# Patient Record
Sex: Female | Born: 1965 | Race: Black or African American | Hispanic: No | Marital: Single | Smoking: Never smoker
Health system: Southern US, Community
[De-identification: ages and names within clinical notes are randomized; demographics above are authoritative.]

## PROBLEM LIST (undated history)

## (undated) HISTORY — PX: TUBAL LIGATION: SHX77

---

## 1998-02-18 ENCOUNTER — Emergency Department (HOSPITAL_COMMUNITY): Admission: EM | Admit: 1998-02-18 | Discharge: 1998-02-18 | Payer: Self-pay | Admitting: Emergency Medicine

## 1998-07-29 ENCOUNTER — Inpatient Hospital Stay (HOSPITAL_COMMUNITY): Admission: AD | Admit: 1998-07-29 | Discharge: 1998-07-29 | Payer: Self-pay | Admitting: *Deleted

## 2000-03-20 ENCOUNTER — Emergency Department (HOSPITAL_COMMUNITY): Admission: EM | Admit: 2000-03-20 | Discharge: 2000-03-20 | Payer: Self-pay | Admitting: Emergency Medicine

## 2004-08-16 ENCOUNTER — Emergency Department (HOSPITAL_COMMUNITY): Admission: EM | Admit: 2004-08-16 | Discharge: 2004-08-16 | Payer: Self-pay | Admitting: Emergency Medicine

## 2010-01-12 ENCOUNTER — Emergency Department (HOSPITAL_COMMUNITY): Admission: EM | Admit: 2010-01-12 | Discharge: 2010-01-12 | Payer: Self-pay | Admitting: Family Medicine

## 2010-06-09 ENCOUNTER — Ambulatory Visit: Payer: Self-pay | Admitting: Family Medicine

## 2010-06-09 LAB — CONVERTED CEMR LAB
AST: 12 units/L (ref 0–37)
Albumin: 3.7 g/dL (ref 3.5–5.2)
BUN: 11 mg/dL (ref 6–23)
CO2: 23 meq/L (ref 19–32)
Calcium: 9.2 mg/dL (ref 8.4–10.5)
Chloride: 106 meq/L (ref 96–112)
Creatinine, Ser: 0.8 mg/dL (ref 0.40–1.20)
Hemoglobin: 10.3 g/dL — ABNORMAL LOW (ref 12.0–15.0)
Lymphocytes Relative: 47 % — ABNORMAL HIGH (ref 12–46)
Lymphs Abs: 1.8 10*3/uL (ref 0.7–4.0)
Monocytes Absolute: 0.4 10*3/uL (ref 0.1–1.0)
Monocytes Relative: 11 % (ref 3–12)
Neutro Abs: 1.5 10*3/uL — ABNORMAL LOW (ref 1.7–7.7)
Neutrophils Relative %: 40 % — ABNORMAL LOW (ref 43–77)
Potassium: 4.1 meq/L (ref 3.5–5.3)
RBC: 4.15 M/uL (ref 3.87–5.11)
TSH: 0.009 microintl units/mL — ABNORMAL LOW (ref 0.350–4.500)
WBC: 3.8 10*3/uL — ABNORMAL LOW (ref 4.0–10.5)

## 2010-08-04 ENCOUNTER — Emergency Department (HOSPITAL_COMMUNITY): Admission: EM | Admit: 2010-08-04 | Discharge: 2010-08-04 | Payer: Self-pay | Admitting: Family Medicine

## 2010-08-10 ENCOUNTER — Emergency Department (HOSPITAL_COMMUNITY): Admission: EM | Admit: 2010-08-10 | Discharge: 2010-08-10 | Payer: Self-pay | Admitting: Family Medicine

## 2010-08-12 ENCOUNTER — Emergency Department (HOSPITAL_COMMUNITY): Admission: EM | Admit: 2010-08-12 | Discharge: 2010-08-12 | Payer: Self-pay | Admitting: Emergency Medicine

## 2010-09-01 ENCOUNTER — Encounter (INDEPENDENT_AMBULATORY_CARE_PROVIDER_SITE_OTHER): Payer: Self-pay | Admitting: Family Medicine

## 2010-09-01 LAB — CONVERTED CEMR LAB
Ferritin: 19 ng/mL (ref 10–291)
Free Thyroxine Index: 2.5 (ref 1.0–3.9)
T3 Uptake Ratio: 32.6 % (ref 22.5–37.0)

## 2010-09-11 ENCOUNTER — Encounter (HOSPITAL_COMMUNITY)
Admission: RE | Admit: 2010-09-11 | Discharge: 2010-09-12 | Payer: Self-pay | Source: Home / Self Care | Attending: Family Medicine | Admitting: Family Medicine

## 2011-01-08 LAB — POCT RAPID STREP A (OFFICE): Streptococcus, Group A Screen (Direct): POSITIVE — AB

## 2012-01-07 ENCOUNTER — Emergency Department (HOSPITAL_COMMUNITY)
Admission: EM | Admit: 2012-01-07 | Discharge: 2012-01-07 | Disposition: A | Discharge status: Home / Self Care | Payer: Self-pay | Attending: Emergency Medicine | Admitting: Emergency Medicine

## 2012-01-07 ENCOUNTER — Encounter (HOSPITAL_COMMUNITY): Payer: Self-pay

## 2012-01-07 DIAGNOSIS — IMO0002 Reserved for concepts with insufficient information to code with codable children: Secondary | ICD-10-CM | POA: Insufficient documentation

## 2012-01-07 DIAGNOSIS — R454 Irritability and anger: Secondary | ICD-10-CM

## 2012-01-07 DIAGNOSIS — R4589 Other symptoms and signs involving emotional state: Secondary | ICD-10-CM | POA: Insufficient documentation

## 2012-01-07 LAB — RAPID URINE DRUG SCREEN, HOSP PERFORMED
Amphetamines: NOT DETECTED
Barbiturates: NOT DETECTED
Benzodiazepines: NOT DETECTED
Tetrahydrocannabinol: POSITIVE — AB

## 2012-01-07 LAB — CBC
Hemoglobin: 10.7 g/dL — ABNORMAL LOW (ref 12.0–15.0)
MCH: 25 pg — ABNORMAL LOW (ref 26.0–34.0)
MCHC: 32.9 g/dL (ref 30.0–36.0)
MCV: 75.9 fL — ABNORMAL LOW (ref 78.0–100.0)
RBC: 4.28 MIL/uL (ref 3.87–5.11)

## 2012-01-07 LAB — PREGNANCY, URINE: Preg Test, Ur: NEGATIVE

## 2012-01-07 LAB — COMPREHENSIVE METABOLIC PANEL
ALT: 10 U/L (ref 0–35)
AST: 17 U/L (ref 0–37)
Albumin: 3.7 g/dL (ref 3.5–5.2)
CO2: 23 mEq/L (ref 19–32)
Calcium: 9.1 mg/dL (ref 8.4–10.5)
Chloride: 107 mEq/L (ref 96–112)
Creatinine, Ser: 0.76 mg/dL (ref 0.50–1.10)
Sodium: 139 mEq/L (ref 135–145)

## 2012-01-07 MED ORDER — LORAZEPAM 1 MG PO TABS: 1.0000 mg | ORAL_TABLET | Freq: Three times a day (TID) | ORAL | Status: DC | PRN

## 2012-01-07 MED ORDER — IBUPROFEN 200 MG PO TABS: 600.0000 mg | ORAL_TABLET | Freq: Three times a day (TID) | ORAL | Status: DC | PRN

## 2012-01-07 MED ORDER — ZOLPIDEM TARTRATE 5 MG PO TABS: 5.0000 mg | ORAL_TABLET | Freq: Every evening | ORAL | Status: DC | PRN

## 2012-01-07 MED ORDER — ACETAMINOPHEN 325 MG PO TABS: 650.0000 mg | ORAL_TABLET | ORAL | Status: DC | PRN

## 2012-01-07 NOTE — ED Notes (Signed)
Tele-psych report given to Dr. Fonnie Jarvis.  Waiting for discharge.

## 2012-01-07 NOTE — Discharge Instructions (Signed)
 RESOURCE GUIDE  Dental Problems  Patients with Medicaid: [facility]                     [facility] [address]                                           [address] Phone:  [phone]                                                  Phone:  [phone]  If unable to pay or uninsured, contact:  [facility] or [facility] to become qualified for the adult dental clinic.  Chronic Pain Problems Contact [facility]  [phone] Patients need to be referred by their primary care doctor.  Insufficient Money for Medicine Contact [employer]:  call "211" or [facility] 271-5999.  No Primary Care Doctor Call [facility]  832-8000 Other agencies that provide inexpensive medical care    [facility]  [phone]     Internal Medicine  [phone]    [facility]  [phone]    Women's Clinic  [phone]    [facility]  [phone]    [facility]  [phone]  Psychological Services [facility]  [phone] [facility]  [phone] [facility]   [phone] (emergency services [phone])  Substance Abuse Resources [facility]  336-882-2125 [facility] 336-784-9470 [facility] 336-285-9073 [facility] 336-845-3988 Residential & Outpatient Substance Abuse Program  800-659-3381  Abuse/Neglect [place] Child Abuse Hotline (336) 641-3795 [place] [facility] Hotline 800-378-5315 (After Hours)  Emergency Shelter [facility] (336) 271-5985  [facility] (336) 275-9566 [facility] (704) 372-4663  MRSA Hotline #:   832-7006    [place] [facility]  [facility]     [employer]                          [facility] [address] [place]                       [address]      [address]  [place]                                                [place]                            [place] Phone:  [phone]                                   Phone:  [phone]                 Phone:  [phone]  [facility] Phone:    [phone]  [place] Child Abuse Hotline (336) 342-1394 (336) 342-3537 (After Hours)   
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
## 2012-01-07 NOTE — ED Notes (Signed)
Telepsych completed.  
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
## 2012-01-07 NOTE — ED Notes (Signed)
Tele-psyche started.

## 2012-01-07 NOTE — ED Notes (Signed)
Pt. Moved to room 30 TCU.

## 2012-05-06 ENCOUNTER — Encounter (HOSPITAL_COMMUNITY): Payer: Self-pay | Admitting: Emergency Medicine

## 2012-05-06 ENCOUNTER — Emergency Department (HOSPITAL_COMMUNITY)
Admission: EM | Admit: 2012-05-06 | Discharge: 2012-05-07 | Disposition: A | Discharge status: Home / Self Care | Payer: Self-pay | Attending: Emergency Medicine | Admitting: Emergency Medicine

## 2012-05-06 ENCOUNTER — Emergency Department (HOSPITAL_COMMUNITY): Payer: Self-pay

## 2012-05-06 DIAGNOSIS — F17200 Nicotine dependence, unspecified, uncomplicated: Secondary | ICD-10-CM | POA: Insufficient documentation

## 2012-05-06 DIAGNOSIS — M25561 Pain in right knee: Secondary | ICD-10-CM

## 2012-05-06 DIAGNOSIS — M1710 Unilateral primary osteoarthritis, unspecified knee: Secondary | ICD-10-CM | POA: Insufficient documentation

## 2012-05-06 DIAGNOSIS — M1711 Unilateral primary osteoarthritis, right knee: Secondary | ICD-10-CM

## 2012-05-06 LAB — URINALYSIS, ROUTINE W REFLEX MICROSCOPIC
Bilirubin Urine: NEGATIVE
Ketones, ur: NEGATIVE mg/dL
Nitrite: NEGATIVE
Protein, ur: NEGATIVE mg/dL
Urobilinogen, UA: 1 mg/dL (ref 0.0–1.0)
pH: 6 (ref 5.0–8.0)

## 2012-05-06 NOTE — ED Notes (Signed)
Patient presents stating she has been having lower back pain for about 3 months.  Pain to legs bilaterally, right knee swollen.  +pulses to legs bilaterally.

## 2012-05-06 NOTE — ED Notes (Signed)
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
## 2012-05-06 NOTE — ED Provider Notes (Signed)
History     CSN: [id number]  Arrival date & time 05/06/12  2101   First MD Initiated Contact with Patient 05/06/12 2226      Chief Complaint  Patient presents with  . Leg Pain    (Consider location/radiation/quality/duration/timing/severity/associated sxs/prior treatment) HPI Comments: She first started having pain in her right knee 3 weeks ago. She denies any trauma at the time of pain onset.The pain is worse with weightbearing, flexion, and walking down steps. The pain is relieved by elevation, tylenol, compression brace and rest.  She describes the pain as dull and a 4 out of 10 on the pain scale. She is not currently taking any other medications. She denies any surgeries in her right knee.She denies past history or family history of blood clots. She notes that she may have dislocated her right kneecap 2 months ago while dancing. The pain and swelling resolved with ice. Denies fever.    Patient is a 46 y.o. female presenting with leg pain. The history is provided by the patient.  Leg Pain  Pertinent negatives include no numbness.    History reviewed. No pertinent past medical history.  Past Surgical History  Procedure Date  . Tubal ligation     History reviewed. No pertinent family history.  History  Substance Use Topics  . Smoking status: Passive Smoker  . Smokeless tobacco: Not on file  . Alcohol Use: No    OB History    Grav Para Term Preterm Abortions TAB SAB Ect Mult Living                  Review of Systems  Constitutional: Negative for fever and chills.  Respiratory: Negative for cough and shortness of breath.   Cardiovascular: Negative for chest pain and leg swelling.  Musculoskeletal: Positive for joint swelling and arthralgias. Negative for myalgias.  Neurological: Negative for weakness and numbness.    Allergies  Lamictal  Home Medications   Current Outpatient Rx  Name Route Sig Dispense Refill  . ACETAMINOPHEN 500 MG PO TABS Oral Take 1,000 mg  by mouth every 6 (six) hours as needed. For pain.    Marland Kitchen DIPHENHYDRAMINE HCL 25 MG PO TABS Oral Take 50 mg by mouth every 8 (eight) hours as needed. For itching/anxiety.      BP 123/81  Pulse 89  Temp 98.4 F (36.9 C) (Oral)  Resp 18  SpO2 98%  Physical Exam  Nursing note and vitals reviewed. Constitutional: She is oriented to person, place, and time. She appears well-developed and well-nourished. No distress.  HENT:  Head: Normocephalic and atraumatic.  Neck: Neck supple.  Pulmonary/Chest: Effort normal.  Musculoskeletal:       Right knee: She exhibits normal range of motion, no swelling, no ecchymosis, no deformity, no laceration, no erythema, normal alignment, no LCL laxity, normal patellar mobility, no bony tenderness, normal meniscus and no MCL laxity. no tenderness found. No medial joint line, no lateral joint line, no MCL, no LCL and no patellar tendon tenderness noted.       Left knee: Normal.       Right ankle: She exhibits normal pulse.       Left ankle: She exhibits normal pulse.       Right lower leg: She exhibits no tenderness.       Left lower leg: She exhibits no tenderness.       Lower extremities: strength 5/5, sensation intact, distal pulses intact.  Right knee with no pain on anterior or  posterior stress, valgus or varus stress.  Thessaly test is negative.  Distal pulses intact. No calf tenderness.    Neurological: She is alert and oriented to person, place, and time.  Skin: Skin is warm and dry. No rash noted. She is not diaphoretic. No erythema.    ED Course  Procedures (including critical care time)   Labs Reviewed  URINALYSIS, ROUTINE W REFLEX MICROSCOPIC   Dg Knee Complete 4 Views Right  05/06/2012  *RADIOLOGY REPORT*  Clinical Data: Pain over past 3 weeks.  No known injury.  RIGHT KNEE - COMPLETE 4+ VIEW  Comparison: None.  Findings: Degenerative changes most notable patellofemoral joint space and medial tibiofemoral joint space.  No fracture or  dislocation.  IMPRESSION: Degenerative changes most notable patellofemoral joint space and medial tibiofemoral joint space.  Original Report Authenticated By: Fuller Canada, M.D.     1. Right knee pain   2. Degenerative joint disease of knee, right       MDM  Patient with 3 weeks of right knee pain, no specific injury, improved with compression and tylenol and rest.  Worse with activity.  Xray shows degenerative changes.  No clinical concern for septic joint or DVT or acute injury to knee.  Pt d/c home with ibuprofen, norco, ortho follow up.  Return precautions given.  Patient verbalizes understanding and agrees with plan.          Dillard Cannon [place], [place] 05/07/12 0025

## 2012-05-07 MED ORDER — IBUPROFEN 800 MG PO TABS: 800.0000 mg | ORAL_TABLET | Freq: Three times a day (TID) | ORAL | Status: AC | PRN

## 2012-05-07 MED ORDER — HYDROCODONE-ACETAMINOPHEN 5-325 MG PO TABS: 1.0000 | ORAL_TABLET | Freq: Four times a day (QID) | ORAL | Status: AC | PRN

## 2012-05-07 NOTE — ED Provider Notes (Signed)
Medical screening examination/treatment/procedure(s) were performed by non-physician practitioner and as supervising physician I was immediately available for consultation/collaboration.   Codey Burling E Benzion Mesta, MD 05/07/12 1617 

## 2012-09-07 ENCOUNTER — Emergency Department (HOSPITAL_COMMUNITY): Payer: No Typology Code available for payment source

## 2012-09-07 ENCOUNTER — Encounter (HOSPITAL_COMMUNITY): Payer: Self-pay | Admitting: *Deleted

## 2012-09-07 ENCOUNTER — Emergency Department (HOSPITAL_COMMUNITY)
Admission: EM | Admit: 2012-09-07 | Discharge: 2012-09-07 | Disposition: A | Discharge status: Home / Self Care | Payer: No Typology Code available for payment source | Attending: Emergency Medicine | Admitting: Emergency Medicine

## 2012-09-07 DIAGNOSIS — Y9289 Other specified places as the place of occurrence of the external cause: Secondary | ICD-10-CM | POA: Insufficient documentation

## 2012-09-07 DIAGNOSIS — Y9389 Activity, other specified: Secondary | ICD-10-CM | POA: Insufficient documentation

## 2012-09-07 DIAGNOSIS — S0993XA Unspecified injury of face, initial encounter: Secondary | ICD-10-CM | POA: Insufficient documentation

## 2012-09-07 DIAGNOSIS — R51 Headache: Secondary | ICD-10-CM | POA: Insufficient documentation

## 2012-09-07 DIAGNOSIS — IMO0002 Reserved for concepts with insufficient information to code with codable children: Secondary | ICD-10-CM | POA: Insufficient documentation

## 2012-09-07 MED ORDER — DIAZEPAM 5 MG PO TABS: 5.0000 mg | ORAL_TABLET | Freq: Two times a day (BID) | ORAL | Status: DC

## 2012-09-07 MED ORDER — HYDROCODONE-ACETAMINOPHEN 5-325 MG PO TABS: 2.0000 | ORAL_TABLET | Freq: Four times a day (QID) | ORAL | Status: DC | PRN

## 2012-09-07 NOTE — ED Notes (Signed)
Patient verbalized understanding of discharge instructions, follow up care and rx x2. 
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
## 2012-09-08 NOTE — ED Provider Notes (Signed)
Medical screening examination/treatment/procedure(s) were performed by non-physician practitioner and as supervising physician I was immediately available for consultation/collaboration.  Doug Sou, MD 09/08/12 1742

## 2012-10-19 IMAGING — CR DG KNEE COMPLETE 4+V*R*
4 series · 4 of 4 positions shown · non-contrast
Comparison: None.

CLINICAL DATA: Pain over past 3 weeks.  No known injury.

RIGHT KNEE - COMPLETE 4+ VIEW

[x knee ap right]
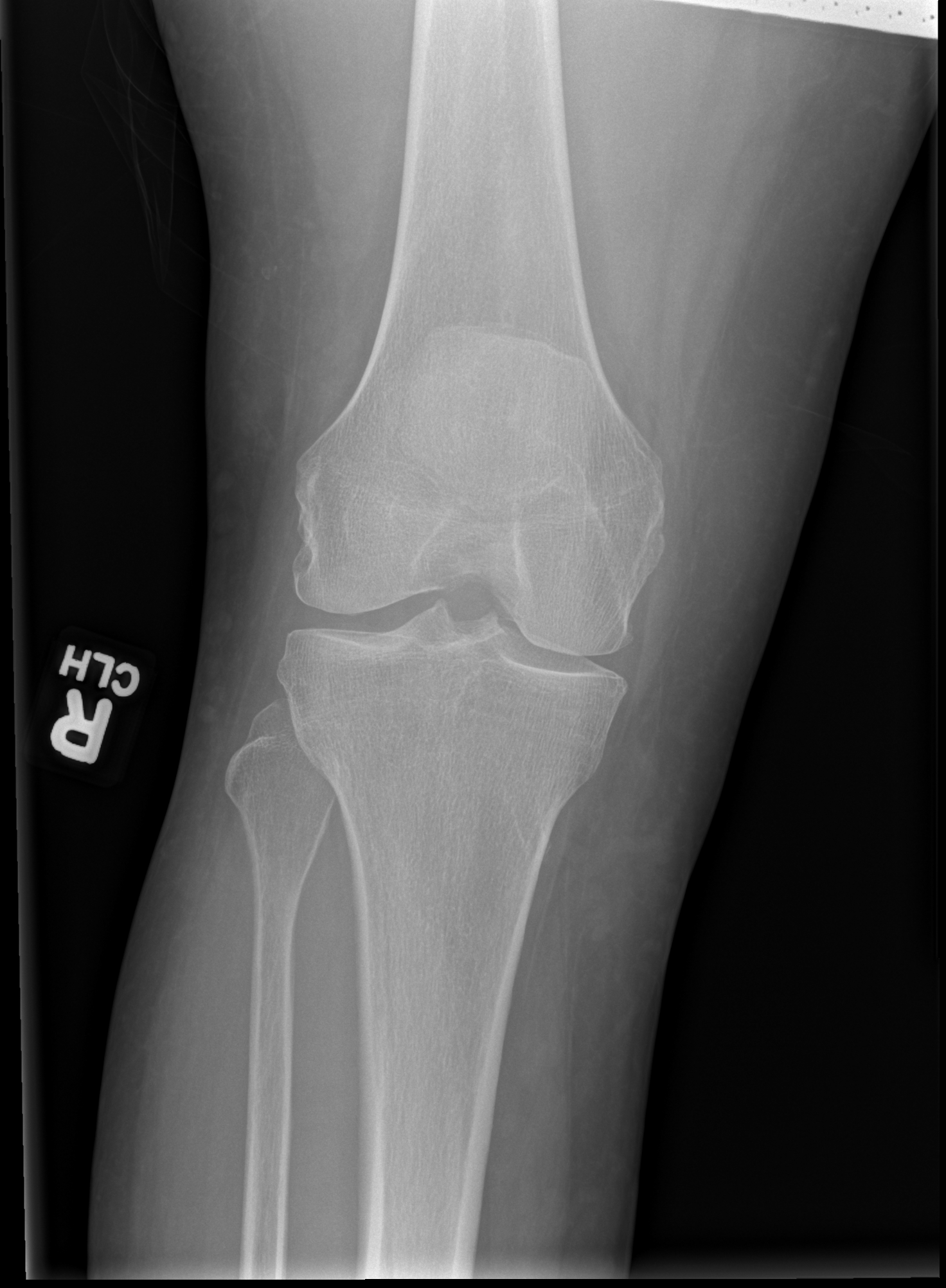

[x knee obl right (1 of 2)]
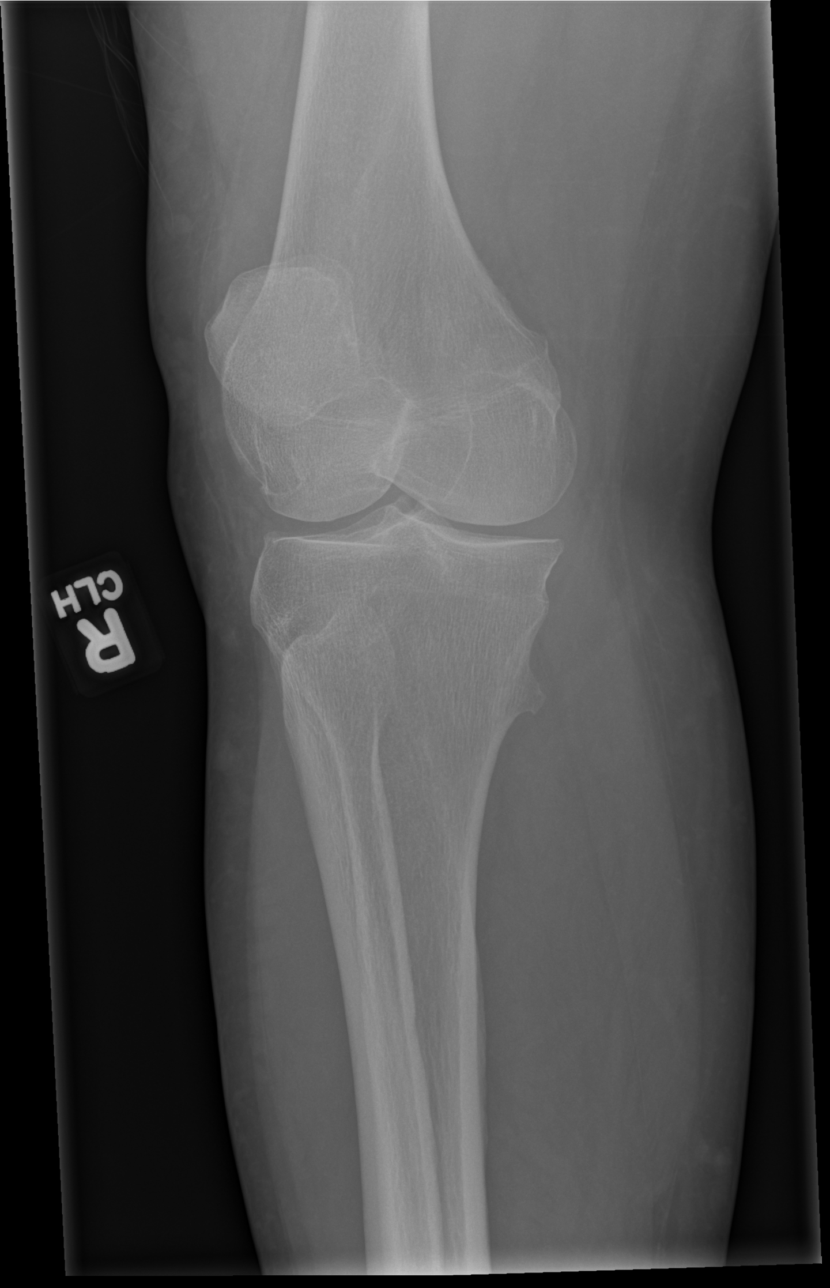

[x knee obl right (2 of 2)]
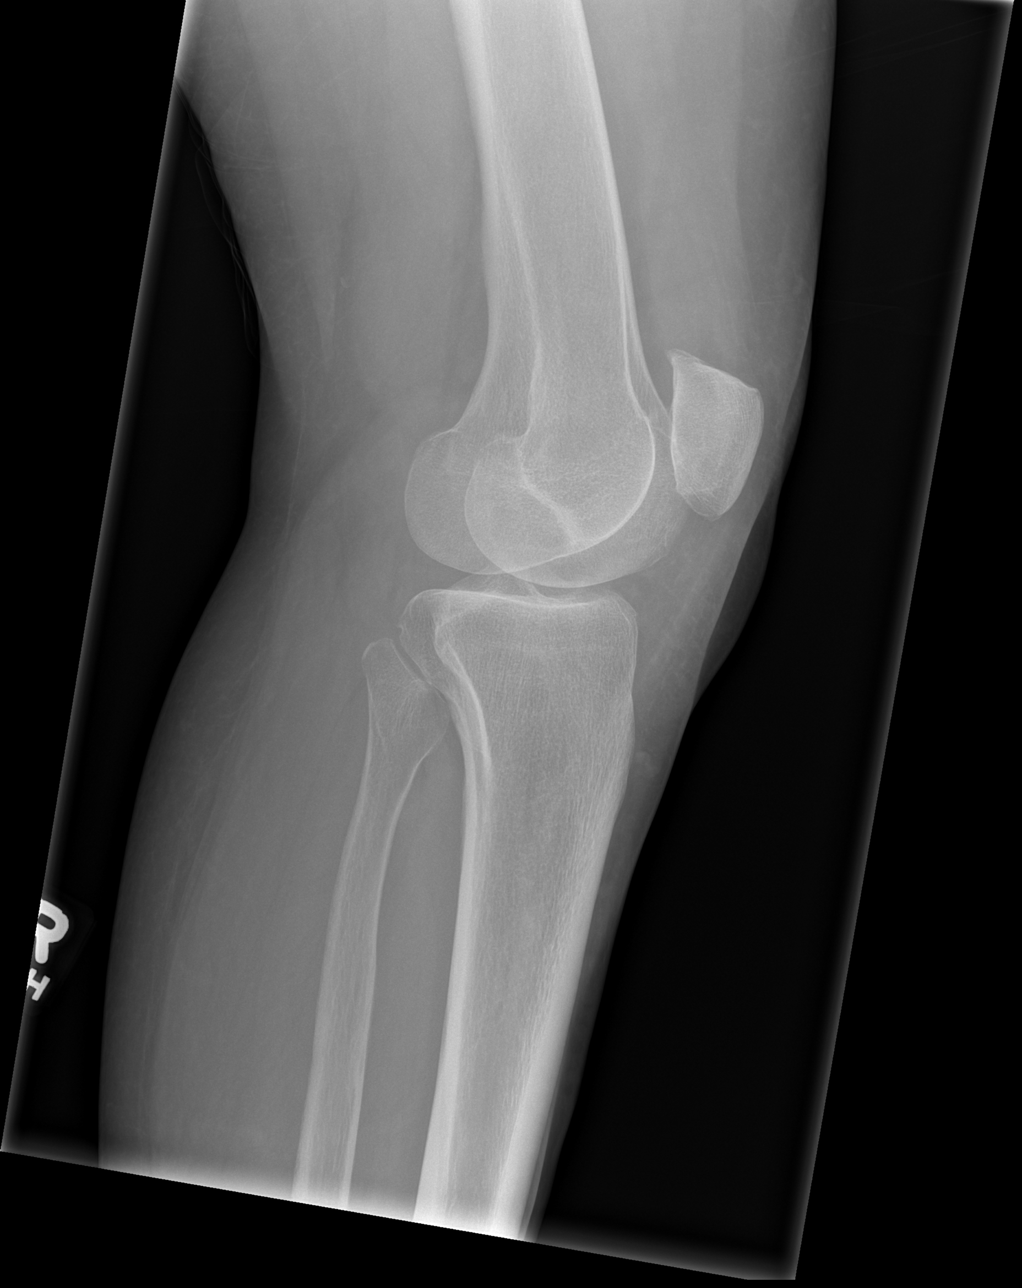

[x knee lat right]
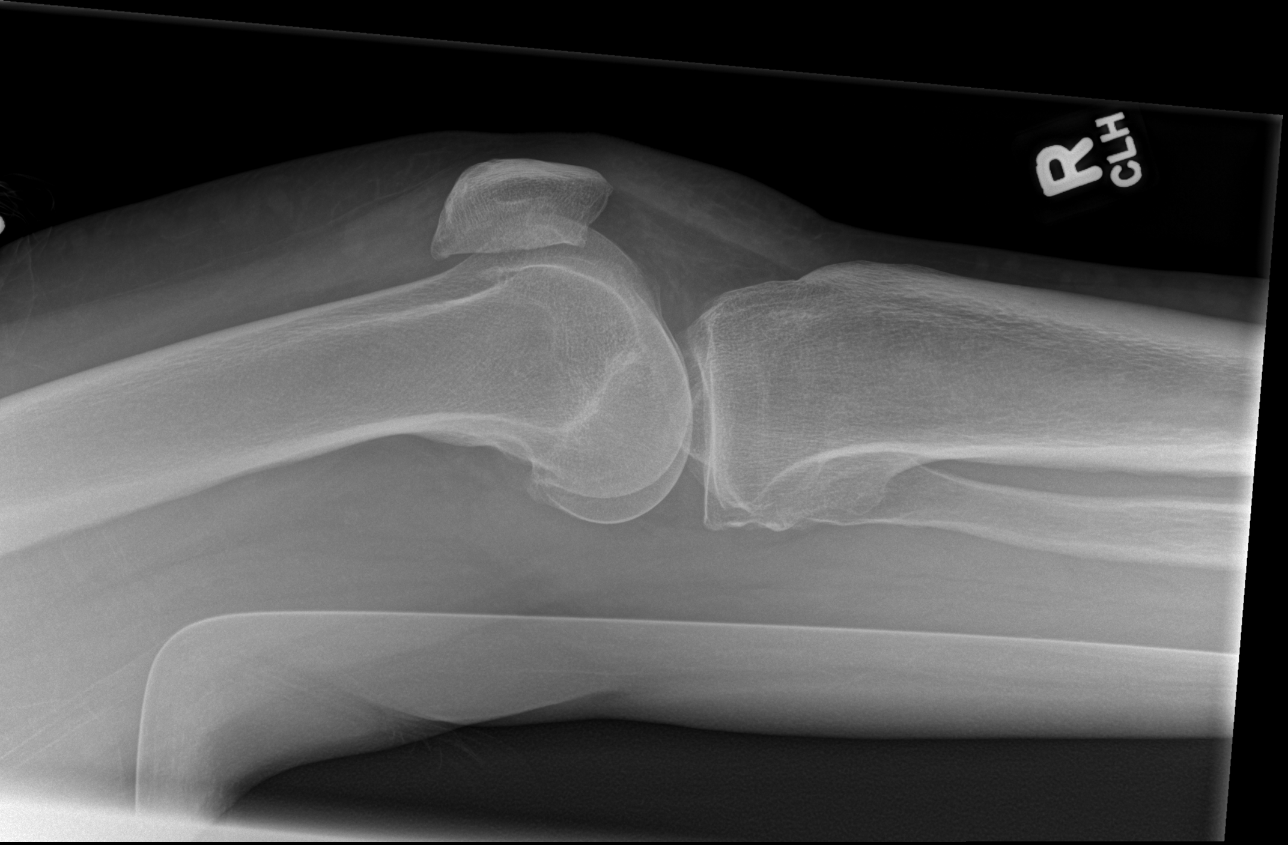

[4 of 4 positions shown; findings below may reference images not displayed]

FINDINGS: Degenerative changes most notable patellofemoral joint
space and medial tibiofemoral joint space.

No fracture or dislocation.
IMPRESSION: Degenerative changes most notable patellofemoral joint space and
medial tibiofemoral joint space.

## 2021-04-08 ENCOUNTER — Other Ambulatory Visit: Payer: Self-pay

## 2021-04-08 ENCOUNTER — Ambulatory Visit (HOSPITAL_COMMUNITY)
Admission: EM | Admit: 2021-04-08 | Discharge: 2021-04-08 | Disposition: A | Discharge status: Home / Self Care | Payer: No Payment, Other | Attending: Nurse Practitioner | Admitting: Nurse Practitioner

## 2021-04-08 DIAGNOSIS — Z046 Encounter for general psychiatric examination, requested by authority: Secondary | ICD-10-CM

## 2021-04-08 DIAGNOSIS — F39 Unspecified mood [affective] disorder: Secondary | ICD-10-CM | POA: Insufficient documentation

## 2021-04-08 DIAGNOSIS — R462 Strange and inexplicable behavior: Secondary | ICD-10-CM | POA: Insufficient documentation

## 2021-04-08 NOTE — Discharge Instructions (Addendum)
Recommend patient follow up with a primary care provider to have thyroid checked.   Discharge recommendations:  Please follow-up with psychiatry and therapy. Please follow up with your primary care provider for all medical related needs.   Therapy: We recommend that patient participate in individual therapy to address mental health concerns.  Safety:  The patient should abstain from use of illicit substances/drugs and abuse of any medications. If symptoms worsen or do not continue to improve or if the patient becomes actively suicidal or homicidal then it is recommended that the patient return to the closest hospital emergency department, the [facility], or call 911 for further evaluation and treatment. National Suicide Prevention Lifeline 1-800-SUICIDE or 820-280-3888.
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
## 2021-04-08 NOTE — ED Notes (Signed)
Patient given AVS and reviewed it with provider. Patient discharged ambulatory to lobby per order by Barbara Cower, NP.  Patient discharged in stable condition.  No acute distress noted.  All belongings returned to patient.

## 2021-04-08 NOTE — ED Provider Notes (Addendum)
Behavioral Health Urgent Care Medical Screening Exam  Patient Name: Tracy Keith MRN: [id number] Date of Evaluation: 04/08/21 Chief Complaint:   Diagnosis:  Final diagnoses:  Unspecified mood (affective) disorder (HCC)  Involuntary commitment    History of Present illness: Tracy Keith is a 55 y.o. female who presents to [facility] with law enforcement under IVC. Patient was petitioned for IVC by her daughter, Chyrel Masson.  "respondent has been diagnosed with schizophrenia; respondent has been talking herself, accusing housmates of poisoning her. 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   On evaluation, patient is alert and oriented x4.  She is initially calm and cooperative.  Her speech is clear and coherent.  When asked why did she think she was IVCd, she states "this is [place], minimum wage is 7.25, this is just [place] for you."  Patient denies suicidal ideations.  She denies homicidal ideations.  She denies auditory and visual hallucinations.  She denies paranoia.  Denies thought insertion.  Patient denies any psychiatric history.  Patient reports that she is a Engineer, civil (consulting).  She states that she worked in [place] for several years then she went to [place] and was there when COVID started.  States that when she was in [place] she was diagnosed with thyroid nodules.  She states that she has not followed up with anyone regarding her thyroid.  Discussed the importance of following up with thyroid concerns.  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 She states that it is about 7 people.  She states that they are  stealing narcotics and selling them so they can purchase [employer].  She she states that she thinks someone from HR probably stole her license when she was working in [place].  Based on these statements, it appears that the patient is experiencing a delusion.  However, at this time it does not appear she is an imminent risk to herself or others.  Discussed continuous assessment, patient adamantly refuses to have COVID testing, labs, and participate in admission.  Patient became very irritable towards this provider and nursing staff.  Patient was again very pleasant once she was informed that she was going to be discharged.  She expressed her gratitude for our care and stated that this was a really nice facility.   Collateral:  Patient provided TTS and provider verbal consent to contact her daughters. TTS and provider contacted the patient's daughter, Manuella Ghazi  pts daughter reports that her mother has been verbally  aggressive with others and that she thinks she has schizophrenia because she has taken Jordan in the past. Ms. Yetta Barre states that she is not sure if the patient has actually received a diagnosis of schizophrenia. Pts daughter reports that her mother is paranoid--thinks people are trying to poison her and that her mother thinks that people are stealing her personal belongings whenever she can't find anything. Pts daughter feels she has thyroid issues that could be a contributing factor to her mood fluctuations. Patient's daughter denies that the patient has made any suicidal statements or homicidal statements.   Psychiatric Specialty Exam  Presentation  General Appearance:Appropriate for Environment; Well Groomed  Eye  Contact:Good  Speech:Clear and Coherent; Normal Rate  Speech Volume:Normal  Handedness:Right   Mood and Affect  Mood:Irritable  Affect:Congruent   Thought Process  Thought Processes:Coherent; Linear  Descriptions of Associations:Intact  Orientation:Full  (Time, Place and Person)  Thought Content:Delusions  Diagnosis of Schizophrenia or Schizoaffective disorder in past: No  Duration of Psychotic Symptoms: Greater than six months  Hallucinations:None  Ideas of Reference:Delusions  Suicidal Thoughts:No  Homicidal Thoughts:No   Sensorium  Memory:Immediate Good; Recent Good; Remote Good  Judgment:Intact  Insight:Present   Executive Functions  Concentration:Good  Attention Span:Good  Recall:Good  Fund of Knowledge:Good  Language:Good   Psychomotor Activity  Psychomotor Activity:Normal   Assets  Assets:Communication Skills; Housing; Physical Health; Resilience; Social Support   Sleep  Sleep:Fair  Number of hours:  No data recorded  No data recorded  Physical Exam: Physical Exam Constitutional:      General: She is not in acute distress.    Appearance: She is not ill-appearing, toxic-appearing or diaphoretic.  HENT:     Head: Normocephalic.  Eyes:     Pupils: Pupils are equal, round, and reactive to light.  Cardiovascular:     Rate and Rhythm: Normal rate.  Pulmonary:     Effort: Pulmonary effort is normal. No respiratory distress.  Musculoskeletal:        General: Normal range of motion.  Neurological:     General: No focal deficit present.     Mental Status: She is alert and oriented to person, place, and time.  Psychiatric:        Speech: Speech normal.        Thought Content: Thought content is delusional. Thought content is not paranoid. Thought content does not include homicidal or suicidal ideation.   Review of Systems  Constitutional:  Negative for chills, diaphoresis, fever, malaise/fatigue and weight loss.  Respiratory:  Negative for cough and shortness of breath.   Cardiovascular:  Negative for chest pain.  Gastrointestinal:  Negative for diarrhea, nausea and vomiting.  Psychiatric/Behavioral:  Negative for depression, hallucinations, memory loss, substance abuse and suicidal ideas. The  patient is not nervous/anxious and does not have insomnia.    Blood pressure 129/89, pulse 83, temperature 98.4 F (36.9 C), temperature source Tympanic, resp. rate 16, SpO2 98 %. There is no height or weight on file to calculate BMI.  Musculoskeletal: Strength & Muscle Tone: within normal limits Gait & Station: normal Patient leans: N/A   [facility] MSE Discharge Disposition for Follow up and Recommendations: Based on my evaluation the patient does not appear to have an emergency medical condition and can be discharged with resources and follow up care in outpatient services for Medication Management and Individual Therapy  Based on my evaluation, TTS assessment, and collateral information this patient does not appear to be an imminent risk to herself or others. IVC rescinded.   Patient encouraged to follow up at [facility] during walk-in hours.   Recommend follow up with a primary care provider regarding thyroid concerns.    Jackelyn Poling, NP 04/08/2021, 9:15 PM

## 2021-05-29 DIAGNOSIS — Z5320 Procedure and treatment not carried out because of patient's decision for unspecified reasons: Secondary | ICD-10-CM | POA: Insufficient documentation

## 2021-06-25 DIAGNOSIS — E041 Nontoxic single thyroid nodule: Secondary | ICD-10-CM | POA: Insufficient documentation

## 2021-09-04 ENCOUNTER — Emergency Department (HOSPITAL_COMMUNITY)
Admission: EM | Admit: 2021-09-04 | Discharge: 2021-09-04 | Discharge status: Against Medical Advice | Payer: Self-pay | Source: Home / Self Care

## 2021-09-04 ENCOUNTER — Other Ambulatory Visit: Payer: Self-pay

## 2021-09-04 ENCOUNTER — Emergency Department (HOSPITAL_COMMUNITY)
Admission: EM | Admit: 2021-09-04 | Discharge: 2021-09-05 | Disposition: A | Discharge status: Home / Self Care | Payer: Self-pay | Attending: Emergency Medicine | Admitting: Emergency Medicine

## 2021-09-04 DIAGNOSIS — F22 Delusional disorders: Secondary | ICD-10-CM | POA: Insufficient documentation

## 2021-09-04 DIAGNOSIS — Z7722 Contact with and (suspected) exposure to environmental tobacco smoke (acute) (chronic): Secondary | ICD-10-CM | POA: Insufficient documentation

## 2021-09-04 DIAGNOSIS — E876 Hypokalemia: Secondary | ICD-10-CM | POA: Insufficient documentation

## 2021-09-04 DIAGNOSIS — T383X1A Poisoning by insulin and oral hypoglycemic [antidiabetic] drugs, accidental (unintentional), initial encounter: Secondary | ICD-10-CM | POA: Insufficient documentation

## 2021-09-04 DIAGNOSIS — F419 Anxiety disorder, unspecified: Secondary | ICD-10-CM | POA: Insufficient documentation

## 2021-09-04 DIAGNOSIS — R42 Dizziness and giddiness: Secondary | ICD-10-CM | POA: Insufficient documentation

## 2021-09-04 DIAGNOSIS — T40412A Poisoning by fentanyl or fentanyl analogs, intentional self-harm, initial encounter: Secondary | ICD-10-CM | POA: Insufficient documentation

## 2021-09-04 DIAGNOSIS — Z20822 Contact with and (suspected) exposure to covid-19: Secondary | ICD-10-CM | POA: Insufficient documentation

## 2021-09-04 DIAGNOSIS — R002 Palpitations: Secondary | ICD-10-CM | POA: Insufficient documentation

## 2021-09-04 DIAGNOSIS — R030 Elevated blood-pressure reading, without diagnosis of hypertension: Secondary | ICD-10-CM | POA: Insufficient documentation

## 2021-09-04 LAB — HCG, QUANTITATIVE, PREGNANCY: hCG, Beta Chain, Quant, S: 2 m[IU]/mL (ref <5)

## 2021-09-04 LAB — RESP PANEL BY RT-PCR (FLU A&B, COVID) ARPGX2
Influenza A by PCR: NEGATIVE
Influenza B by PCR: NEGATIVE
SARS Coronavirus 2 by RT PCR: NEGATIVE

## 2021-09-04 LAB — CBC WITH DIFFERENTIAL/PLATELET
Abs Immature Granulocytes: 0.01 10*3/uL (ref 0.00–0.07)
Basophils Absolute: 0 10*3/uL (ref 0.0–0.1)
Basophils Relative: 1 %
Eosinophils Absolute: 0.3 10*3/uL (ref 0.0–0.5)
Eosinophils Relative: 5 %
HCT: 41 % (ref 36.0–46.0)
Hemoglobin: 13 g/dL (ref 12.0–15.0)
Immature Granulocytes: 0 %
Lymphocytes Relative: 51 %
Lymphs Abs: 2.7 10*3/uL (ref 0.7–4.0)
MCH: 26.6 pg (ref 26.0–34.0)
MCHC: 31.7 g/dL (ref 30.0–36.0)
MCV: 84 fL (ref 80.0–100.0)
Monocytes Absolute: 0.5 10*3/uL (ref 0.1–1.0)
Monocytes Relative: 10 %
Neutro Abs: 1.7 10*3/uL (ref 1.7–7.7)
Neutrophils Relative %: 33 %
Platelets: 260 10*3/uL (ref 150–400)
RBC: 4.88 MIL/uL (ref 3.87–5.11)
RDW: 14.1 % (ref 11.5–15.5)
WBC: 5.2 10*3/uL (ref 4.0–10.5)
nRBC: 0 % (ref 0.0–0.2)

## 2021-09-04 LAB — RAPID URINE DRUG SCREEN, HOSP PERFORMED
Amphetamines: NOT DETECTED
Barbiturates: NOT DETECTED
Benzodiazepines: NOT DETECTED
Cocaine: NOT DETECTED
Opiates: NOT DETECTED
Tetrahydrocannabinol: NOT DETECTED

## 2021-09-04 LAB — COMPREHENSIVE METABOLIC PANEL
ALT: 23 U/L (ref 0–44)
AST: 21 U/L (ref 15–41)
Albumin: 4 g/dL (ref 3.5–5.0)
Alkaline Phosphatase: 89 U/L (ref 38–126)
Anion gap: 9 (ref 5–15)
BUN: 8 mg/dL (ref 6–20)
CO2: 24 mmol/L (ref 22–32)
Calcium: 9.6 mg/dL (ref 8.9–10.3)
Chloride: 106 mmol/L (ref 98–111)
Creatinine, Ser: 0.79 mg/dL (ref 0.44–1.00)
GFR, Estimated: >60 mL/min (ref >60)
Glucose, Bld: 110 mg/dL — ABNORMAL HIGH (ref 70–99)
Potassium: 3.2 mmol/L — ABNORMAL LOW (ref 3.5–5.1)
Sodium: 139 mmol/L (ref 135–145)
Total Bilirubin: 0.4 mg/dL (ref 0.3–1.2)
Total Protein: 8.2 g/dL — ABNORMAL HIGH (ref 6.5–8.1)

## 2021-09-04 LAB — SALICYLATE LEVEL: Salicylate Lvl: <7 mg/dL — ABNORMAL LOW (ref 7.0–30.0)

## 2021-09-04 LAB — ACETAMINOPHEN LEVEL: Acetaminophen (Tylenol), Serum: <10 ug/mL — ABNORMAL LOW (ref 10–30)

## 2021-09-04 LAB — ETHANOL: Alcohol, Ethyl (B): <10 mg/dL (ref <10)

## 2021-09-04 NOTE — ED Notes (Addendum)
Pt resting.
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
## 2021-09-05 MED ORDER — POTASSIUM CHLORIDE CRYS ER 20 MEQ PO TBCR
40.0000 meq | EXTENDED_RELEASE_TABLET | Freq: Once | ORAL | Status: AC
  Administered 2021-09-05: 40 meq via ORAL
  Filled 2021-09-05: qty 2

## 2021-09-05 NOTE — ED Provider Notes (Signed)
[facility] [place] [facility]-EMERGENCY DEPT Provider Note   CSN: [id number] Arrival date & time: 09/04/21  2003     History Chief Complaint  Patient presents with   Anxiety    Tracy Keith is a 55 y.o. female.  HPI Patient is a 55 year old female presents to the ER today with concerns that she was poisoned with insulin or fentanyl.  She states that she started feeling somewhat shaky and anxious earlier today and has been feeling persistently this with since.  She denies any pain and she states she feels relatively well at this time.  Denies any lightheadedness dizziness cough congestion fevers or chills.  She states she did have some mild palpitations earlier I did feel somewhat lightheaded for a moment but did not pass out or coming to passing out.      No past medical history on file.  Patient Active Problem List   Diagnosis Date Noted   Episodic mood disorder [facility])     Past Surgical History:  Procedure Laterality Date   TUBAL LIGATION       OB History   No obstetric history on file.     No family history on file.  Social History   Tobacco Use   Smoking status: Passive Smoke Exposure - Never Smoker  Substance Use Topics   Alcohol use: No   Drug use: No    Home Medications Prior to Admission medications   Medication Sig Start Date End Date Taking? Authorizing Provider  sennosides-docusate sodium (SENOKOT-S) 8.6-50 MG tablet Take 2 tablets by mouth at bedtime.   Yes [provider]    Allergies    Lamictal [lamotrigine]  Review of Systems   Review of Systems  Constitutional:  Negative for chills and fever.  HENT:  Negative for congestion.   Eyes:  Negative for pain.  Respiratory:  Negative for cough and shortness of breath.   Cardiovascular:  Negative for chest pain and leg swelling.  Gastrointestinal:  Negative for abdominal pain, diarrhea, nausea and vomiting.  Genitourinary:  Negative for dysuria.  Musculoskeletal:  Negative for  myalgias.  Skin:  Negative for rash.  Neurological:  Negative for dizziness and headaches.  Psychiatric/Behavioral:  Positive for agitation.    Physical Exam Updated Vital Signs BP (!) 124/96   Pulse 81   Temp 98.8 F (37.1 C) (Oral)   Resp 18   Ht 5\' 4"  (1.626 m)   Wt 77.1 kg   SpO2 100%   BMI 29.18 kg/m   Physical Exam Vitals and nursing note reviewed.  Constitutional:      General: She is not in acute distress. HENT:     Head: Normocephalic and atraumatic.     Nose: Nose normal.     Mouth/Throat:     Mouth: Mucous membranes are moist.  Eyes:     General: No scleral icterus. Cardiovascular:     Rate and Rhythm: Normal rate and regular rhythm.     Pulses: Normal pulses.     Heart sounds: Normal heart sounds.  Pulmonary:     Effort: Pulmonary effort is normal. No respiratory distress.     Breath sounds: Normal breath sounds. No wheezing.  Abdominal:     Palpations: Abdomen is soft.     Tenderness: There is no abdominal tenderness. There is no guarding or rebound.  Musculoskeletal:     Cervical back: Normal range of motion.     Right lower leg: No edema.     Left lower leg: No edema.  Skin:    General: Skin is warm and dry.     Capillary Refill: Capillary refill takes less than 2 seconds.  Neurological:     Mental Status: She is alert. Mental status is at baseline.  Psychiatric:        Mood and Affect: Mood normal.        Behavior: Behavior normal.    ED Results / Procedures / Treatments   Labs (all labs ordered are listed, but only abnormal results are displayed) Labs Reviewed  COMPREHENSIVE METABOLIC PANEL - Abnormal; Notable for the following components:      Result Value   Potassium 3.2 (*)    Glucose, Bld 110 (*)    Total Protein 8.2 (*)    All other components within normal limits  SALICYLATE LEVEL - Abnormal; Notable for the following components:   Salicylate Lvl <7.0 (*)    All other components within normal limits  ACETAMINOPHEN LEVEL -  Abnormal; Notable for the following components:   Acetaminophen (Tylenol), Serum <10 (*)    All other components within normal limits  RESP PANEL BY RT-PCR (FLU A&B, COVID) ARPGX2  CBC WITH DIFFERENTIAL/PLATELET  RAPID URINE DRUG SCREEN, HOSP PERFORMED  ETHANOL  HCG, QUANTITATIVE, PREGNANCY    EKG EKG Interpretation  Date/Time:  Friday September 05 2021 00:35:51 EST Ventricular Rate:  72 PR Interval:  175 QRS Duration: 85 QT Interval:  420 QTC Calculation: 460 R Axis:   53 Text Interpretation: Sinus rhythm Normal ECG Confirmed by Molpus, John ([id number]) on 09/05/2021 12:54:44 AM  Radiology No results found.  Procedures Procedures   Medications Ordered in ED Medications  potassium chloride SA (KLOR-CON) CR tablet 40 mEq (40 mEq Oral Given 09/05/21 0042)    ED Course  I have reviewed the triage vital signs and the nursing notes.  Pertinent labs & imaging results that were available during my care of the patient were reviewed by me and considered in my medical decision making (see chart for details).  Clinical Course as of 09/06/21 0735  Fri Sep 05, 2021  0010 Pulse Rate: 78 [WF]  0010 BP(!): 136/92 [WF]  0010 Resp: 18 [WF]  0010 SpO2: 98 % [WF]  0010 Temp: 98.8 F (37.1 C) [WF]    Clinical Course User Index [WF] Gailen Shelter, PA   MDM Rules/Calculators/A&P                          Patient is a 55 year old female presented to ER today with concerns about being poisoned with either insulin or fentanyl.  Her urine drug screen is negative her lab work is unremarkable apart from mild hypokalemia which she will follow-up with PCP to reevaluate Tylenol salicylates negative.  Ethanol negative.  hCG negative pregnancy.  CBC without leukocytosis or anemia COVID influenza negative.  Patient given 1 dose of potassium she will follow-up with PCP for recheck.  Vital signs were normal limits.  She seems somewhat paranoid but does not seem to have any dangerous delusions is not  having any auditory or visual hallucinations and tells me that she has never had any consideration of suicide nor does she plan to hurt or kill anyone.  She is alert and oriented x3 she is well-appearing on examination seems more concerned than anything. Blood sugar within normal limits here we will discharge patient home she has no symptoms currently she will follow-up with PCP given information for cardiology given palpitations.  Return cautions given and  reviewed.  She is understanding of plan and agreeable.  Patient seems very reasonable throughout encounter.  Final Clinical Impression(s) / ED Diagnoses Final diagnoses:  Palpitations  Elevated blood pressure reading    Rx / DC Orders ED Discharge Orders     None        Gailen Shelter, [place] 09/06/21 0736    Paula Libra, MD 09/06/21 2232

## 2021-09-05 NOTE — Discharge Instructions (Addendum)
Please follow-up with the [facility].  I given you their information.  Please call tomorrow morning to make an appointment. Your blood pressure here in the ER is not severely elevated.  You will have your blood pressure rechecked when you follow-up with the primary care office. Your labs are reassuring without any significant abnormalities apart from low potassium you are given 1 dose of supplementation here you will need to have this potassium level rechecked with your primary care.  Your urinalysis does not show any evidence of opioids/narcotics or any other concerning findings.

## 2022-08-20 ENCOUNTER — Ambulatory Visit (HOSPITAL_COMMUNITY)
Admission: EM | Admit: 2022-08-20 | Discharge: 2022-08-20 | Disposition: A | Discharge status: Home / Self Care | Payer: No Payment, Other | Attending: Psychiatry | Admitting: Psychiatry

## 2022-08-20 DIAGNOSIS — Z79899 Other long term (current) drug therapy: Secondary | ICD-10-CM | POA: Insufficient documentation

## 2022-08-20 DIAGNOSIS — F3160 Bipolar disorder, current episode mixed, unspecified: Secondary | ICD-10-CM

## 2022-08-20 DIAGNOSIS — Z818 Family history of other mental and behavioral disorders: Secondary | ICD-10-CM | POA: Insufficient documentation

## 2022-08-20 NOTE — ED Notes (Signed)
Discharge instructions provided and Pt stated understanding. Pt alert, orient and ambulatory prior to d/c from facility. No personal belongings returned. Gave pt information regarding outpatient therapy and where to go. Safety maintained.

## 2022-08-20 NOTE — Progress Notes (Signed)
   08/20/22 1300  Tracy Keith (Walk-ins at [facility] only)  How Did You Hear About [place]? Self  What Is the Reason for Your Visit/Call Today? Patient recently relocated from [place] to live with family.  She reports she was recently charged with communicating threats to a judge in [place].  She states she disagreed with a decision the judge made and she left a threatening voicemail.  She states she served 4 mos and was placed on [place].  She is court ordered to stay on medication and continue with outpatient treatment.  She is Dx with Bipolar and is currently Rx Latuda.  She has 2 wks of medications left and is hoping to be connected with a psychiatrist today.  She is uninsured and informed about [facility] outpt program.  Patient denies SI, HI, AVH or SA hx.  How Long Has This Been Causing You Problems? <Week  Have You Recently Had Any Thoughts About Hurting Yourself? No  Are You Planning to Commit Suicide/Harm Yourself At This time? No  Have you Recently Had Thoughts About [place]? No  Are You Planning To Harm Someone At This Time? No  Are you currently experiencing any auditory, visual or other hallucinations? No  Have You Used Any Alcohol or Drugs in the Past 24 Hours? No  Do you have any current medical co-morbidities that require immediate attention? No  Clinician description of patient physical appearance/behavior: Patient is calm, coopeartive, engaging AAOx5.  What Do You Feel Would Help You the Most Today? Medication(s)  If access to [facility] was not available, would you have sought care in the Emergency Department? No  Determination of Need Routine (7 days)  Options For Referral Outpatient Therapy;Medication Management

## 2022-08-20 NOTE — ED Provider Notes (Addendum)
Behavioral Health Urgent Care Medical Screening Exam  Patient Name: Tracy Keith MRN: [id number] Date of Evaluation: 08/20/22 Chief Complaint:   Diagnosis:  Final diagnoses:  Mixed bipolar I disorder (HCC)    History of Present illness: Tracy Keith is a 56 y.o. female.  " I need to get medications... I also  need Therapy...".  Patient presents voluntarily reporting that she is looking for a mental health outpatient service to help with therapy and medication management. Reports that she has been diagnosed with Bipolar in 2010 and was recently put on Latuda, which is working. She reports that she often has mood swings accompanied with negative reactions and poor decision- making. She recently relocated from [place] to be with her family and needs a local mental health provider. Patient reports that she has a legal  charge that requires her to receive mental health services including medications and therapy. She is expressing motivation for treatment. Reports that she never had therapy in [place]. She used to see a therapist in [place], years ago, through [facility]. Patient is willing to start receiving therapy and medication management through [facility] outpatient services. Denies substance use and states that she used to smoke marijuana but quit last year. Patient is fully alert and oriented  and denying SI/HI/AVH. Does not appear to be responding to internal stimuli. Her thought process is clear and concise. Has good eye contact. She is pleasant on approach. She is well groomed and well nourished. She denies medical issues; reports that she recently visited ED due to low K+, which was resolved. She reports and she eats and sleeps well. Her current VS are WNL and she has no sign of distress. Patient reports a significant family history of mental health issues: her 2 sisters have been diagnosed with Bipolar and take medications. She reports that "mom has mental health issues but I can't remember  which ones". Patient currently lives with her mother who is supportive. Her sisters are supportive as well. She reports that she wants to "get myself together and then find a job". Was encouraged to follow up in outpatient services to start therapy and medication management. More resources and  emotional support provided.   Psychiatric Specialty Exam  Presentation  General Appearance:Appropriate for Environment  Eye Contact:Good  Speech:Clear and Coherent  Speech Volume:Normal  Handedness:Right   Mood and Affect  Mood: Euthymic  Affect: Appropriate; Congruent   Thought Process  Thought Processes: Coherent; Goal Directed; Linear  Descriptions of Associations:Intact  Orientation:Full (Time, Place and Person)  Thought Content:WDL  Diagnosis of Schizophrenia or Schizoaffective disorder in past: No data recorded Duration of Psychotic Symptoms: No data recorded Hallucinations:None  Ideas of Reference:None  Suicidal Thoughts:No  Homicidal Thoughts:No   Sensorium  Memory: Immediate Good; Recent Good; Remote Good  Judgment: Fair  Insight: Present   Executive Functions  Concentration: Good  Attention Span: Good  Recall: Good  Fund of Knowledge: Good  Language: Good   Psychomotor Activity  Psychomotor Activity: Normal   Assets  Assets: Communication Skills; Desire for Improvement; Housing; Physical Health   Sleep  Sleep: Good  Number of hours:  8   No data recorded  Physical Exam: Physical Exam Constitutional:      Appearance: Normal appearance.  HENT:     Head: Normocephalic and atraumatic.     Right Ear: Tympanic membrane normal.     Left Ear: Tympanic membrane normal.     Nose: Nose normal.  Eyes:     Extraocular Movements: Extraocular  movements intact.     Pupils: Pupils are equal, round, and reactive to light.  Cardiovascular:     Rate and Rhythm: Normal rate and regular rhythm.  Musculoskeletal:        General: Normal  range of motion.     Cervical back: Normal range of motion.  Neurological:     General: No focal deficit present.     Mental Status: She is alert and oriented to person, place, and time.  Psychiatric:        Mood and Affect: Mood normal.        Behavior: Behavior normal.        Thought Content: Thought content normal.        Judgment: Judgment normal.  Review of Systems  Constitutional: Negative.   HENT: Negative.    Eyes: Negative.   Respiratory: Negative.    Cardiovascular: Negative.   Gastrointestinal: Negative.   Genitourinary: Negative.   Musculoskeletal: Negative.   Skin: Negative.   Neurological: Negative.   Endo/Heme/Allergies: Negative.   Psychiatric/Behavioral: Negative.    Blood pressure 129/85, pulse 79, temperature 98.6 F (37 C), temperature source Oral, resp. rate 18, SpO2 99 %. There is no height or weight on file to calculate BMI.  Musculoskeletal: Strength & Muscle Tone: within normal limits Gait & Station: normal Patient leans: Right   [place] [facility] Discharge Disposition for Follow up and Recommendations: Based on my evaluation the patient does not appear to have an emergency medical condition and can be discharged with resources and follow up care in outpatient services for Medication Management, Individual Therapy, and Group Therapy  Continue current medication regimen and follow up with outpatient provider for refill as needed.    Tracy Nigh, NP 08/20/2022, 1:53 PM

## 2022-08-20 NOTE — Discharge Instructions (Addendum)
Discharge recommendations:  Patient is to take medications as prescribed. Please see information for follow-up appointment with psychiatry and therapy. Please follow up with your primary care provider for all medical related needs.    Therapy: We recommend that patient participate in individual therapy to address mental health concerns. Call [facility] to schedule an appointment  Atypical antipsychotics: If you are prescribed an atypical antipsychotic, it is recommended that your height, weight, BMI, blood pressure, fasting lipid panel, and fasting blood sugar be monitored by your outpatient providers.  Safety:  The patient should abstain from use of illicit substances/drugs and abuse of any medications. If symptoms worsen or do not continue to improve or if the patient becomes actively suicidal or homicidal then it is recommended that the patient return to the closest hospital emergency department, the [facility], or call 911 for further evaluation and treatment. National Suicide Prevention Lifeline 1-800-SUICIDE or 985-822-1904.  About 988 988 offers 24/7 access to trained crisis counselors who can help people experiencing mental health-related distress. People can call or text 988 or chat 988lifeline.org for themselves or if they are worried about a loved one who may need crisis support.

## 2022-09-02 ENCOUNTER — Encounter (HOSPITAL_COMMUNITY): Payer: Self-pay | Admitting: Student in an Organized Health Care Education/Training Program

## 2022-09-02 ENCOUNTER — Other Ambulatory Visit: Payer: Self-pay

## 2022-09-02 ENCOUNTER — Ambulatory Visit (INDEPENDENT_AMBULATORY_CARE_PROVIDER_SITE_OTHER): Payer: No Payment, Other | Admitting: Student in an Organized Health Care Education/Training Program

## 2022-09-02 VITALS — BP 116/75 | HR 79 | Ht 64.0 in | Wt 155.0 lb

## 2022-09-02 DIAGNOSIS — F411 Generalized anxiety disorder: Secondary | ICD-10-CM | POA: Diagnosis not present

## 2022-09-02 DIAGNOSIS — F3130 Bipolar disorder, current episode depressed, mild or moderate severity, unspecified: Secondary | ICD-10-CM | POA: Diagnosis not present

## 2022-09-02 MED ORDER — ESCITALOPRAM OXALATE 5 MG PO TABS: 5.0000 mg | ORAL_TABLET | Freq: Every day | ORAL | 1 refills | Status: DC

## 2022-09-02 MED ORDER — VENLAFAXINE HCL 37.5 MG PO TABS
37.5000 mg | ORAL_TABLET | Freq: Every day | ORAL | 1 refills | Status: DC
  Filled 2022-09-02: qty 30, 30d supply, fill #0

## 2022-09-02 MED ORDER — VENLAFAXINE HCL 37.5 MG PO TABS: 37.5000 mg | ORAL_TABLET | Freq: Every day | ORAL | 1 refills | Status: DC

## 2022-09-02 MED ORDER — LURASIDONE HCL 80 MG PO TABS: 80.0000 mg | ORAL_TABLET | Freq: Every day | ORAL | 1 refills | Status: DC

## 2022-09-02 MED ORDER — LURASIDONE HCL 80 MG PO TABS
80.0000 mg | ORAL_TABLET | Freq: Every day | ORAL | 1 refills | Status: DC
  Filled 2022-09-02: qty 30, 30d supply, fill #0
  Filled 2022-10-06: qty 30, 30d supply, fill #1
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
## 2022-09-03 ENCOUNTER — Other Ambulatory Visit: Payer: Self-pay

## 2022-09-04 ENCOUNTER — Other Ambulatory Visit: Payer: Self-pay

## 2022-09-22 ENCOUNTER — Ambulatory Visit (INDEPENDENT_AMBULATORY_CARE_PROVIDER_SITE_OTHER): Payer: No Payment, Other | Admitting: Licensed Clinical Social Worker

## 2022-09-22 DIAGNOSIS — F3130 Bipolar disorder, current episode depressed, mild or moderate severity, unspecified: Secondary | ICD-10-CM
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
## 2022-10-06 ENCOUNTER — Other Ambulatory Visit: Payer: Self-pay

## 2022-10-07 ENCOUNTER — Other Ambulatory Visit: Payer: Self-pay

## 2022-10-13 ENCOUNTER — Other Ambulatory Visit: Payer: Self-pay

## 2022-10-13 ENCOUNTER — Ambulatory Visit (INDEPENDENT_AMBULATORY_CARE_PROVIDER_SITE_OTHER): Payer: No Payment, Other | Admitting: Clinical

## 2022-10-13 ENCOUNTER — Ambulatory Visit (INDEPENDENT_AMBULATORY_CARE_PROVIDER_SITE_OTHER): Payer: Self-pay | Admitting: Psychiatry

## 2022-10-13 VITALS — BP 123/80 | HR 77

## 2022-10-13 DIAGNOSIS — F411 Generalized anxiety disorder: Secondary | ICD-10-CM

## 2022-10-13 DIAGNOSIS — F3130 Bipolar disorder, current episode depressed, mild or moderate severity, unspecified: Secondary | ICD-10-CM

## 2022-10-13 MED ORDER — LURASIDONE HCL 80 MG PO TABS
80.0000 mg | ORAL_TABLET | Freq: Every day | ORAL | 2 refills | Status: DC
  Filled 2022-10-13 – 2022-11-06 (×2): qty 30, 30d supply, fill #0
  Filled 2022-12-07: qty 30, 30d supply, fill #1

## 2022-10-13 NOTE — Progress Notes (Signed)
   THERAPIST PROGRESS NOTE  Session Time: 30 minutes  Participation Level: Active  Behavioral Response: CasualAlertEuthymic  Type of Therapy: Individual Therapy  Treatment Goals addressed: Client will identify 3 goals for treatment  ProgressTowards Goals: Progressing  Interventions: CBT and Supportive  Summary:  Tracy Keith is a 56 y.o. female who presents for the scheduled appointment oriented times five, appropriately dressed, and friendly. Client denied hallucinations and delusions.  Client is presenting for initial appointment to establish with a therapist due to her initial therapist leaving the practice. Client reported she moved to [place] from [place] in October 2023. Client reported she was previously receiving mental health treatment when living out of state. Client reported she presented to the [facility] urgent care in October 2023 to establish with outpatient providers and not due to crisis. Client reported a dx history of Bipolar disorder. Client reported history of issues with irritability and auditory hallucinations. Client reported her symptoms are very minimal to none while taking her psych medication Latuda. Client reported no issue with depressive and anxiety symptoms. Client reported she is engaging in outpatient services also as part of court ordered treatment. Client reported she stopped taking Effexor because of symptoms of tardive dyskinesia. Client reported overall she is doing well with no compliant. Evidence of progress towards goal:  client was screened for GAD7 was a 2 and PHQ( is a 0.    10/13/2022    2:33 PM 09/02/2022    8:25 AM  GAD 7 : Generalized Anxiety Score  Nervous, Anxious, on Edge 1 3  Control/stop worrying 1 2  Worry too much - different things 0 2  Trouble relaxing 0 1  Restless 0 2  Easily annoyed or irritable 0 0  Afraid - awful might happen 0 1  Total GAD 7 Score 2 11  Anxiety Difficulty Not difficult at all Not difficult at  all     Flowsheet Row Counselor from 10/13/2022 in [facility]  PHQ-9 Total Score 0          Suicidal/Homicidal: Nowithout intent/plan  Therapist Response:  Therapist began the appointment with introduction and discussing confidentiality. Therapist used CBT to engage using active listening and positive emotional support. Therapist used CBT to ask and the client how she has been feeling since she was last seen at her therapy appointment. Therapist used CBT to ask the client about her adherence and response to psych medication compliance. Therapist completed SDOH. Therapist asked the client to identify therapy goals. Therapist used CBT ask the client to identify her progress with frequency of use with coping skills with continued practice in her daily activity.    Therapist assigned the client homework to practice self care.    Plan: Return again in 3 weeks.  Diagnosis: bipolar 1 disorder, most recent episode depressed  Collaboration of Care: Patient refused AEB none requested by the client.  Patient/Guardian was advised Release of Information must be obtained prior to any record release in order to collaborate their care with an outside provider. Patient/Guardian was advised if they have not already done so to contact the registration department to sign all necessary forms in order for [place] to release information regarding their care.   Consent: Patient/Guardian gives verbal consent for treatment and assignment of benefits for services provided during this visit. Patient/Guardian expressed understanding and agreed to proceed.   Neena Rhymes Whitnie Deleon, LCSW 10/13/2022
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
## 2022-10-16 ENCOUNTER — Encounter (HOSPITAL_COMMUNITY): Payer: No Payment, Other | Admitting: Psychiatry

## 2022-11-06 ENCOUNTER — Other Ambulatory Visit: Payer: Self-pay

## 2022-11-09 ENCOUNTER — Other Ambulatory Visit: Payer: Self-pay

## 2022-11-09 ENCOUNTER — Ambulatory Visit (INDEPENDENT_AMBULATORY_CARE_PROVIDER_SITE_OTHER): Payer: No Payment, Other | Admitting: Clinical

## 2022-11-09 DIAGNOSIS — F3130 Bipolar disorder, current episode depressed, mild or moderate severity, unspecified: Secondary | ICD-10-CM | POA: Diagnosis not present
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
## 2022-12-01 LAB — LIPID PANEL
Chol/HDL Ratio: 3.3 ratio (ref 0.0–4.4)
Cholesterol, Total: 197 mg/dL (ref 100–199)
HDL: 60 mg/dL (ref >39)
LDL Chol Calc (NIH): 129 mg/dL — ABNORMAL HIGH (ref 0–99)
Triglycerides: 40 mg/dL (ref 0–149)
VLDL Cholesterol Cal: 8 mg/dL (ref 5–40)

## 2022-12-01 LAB — CBC WITH DIFFERENTIAL/PLATELET
Basophils Absolute: 0 10*3/uL (ref 0.0–0.2)
Basos: 0 %
EOS (ABSOLUTE): 0.3 10*3/uL (ref 0.0–0.4)
Eos: 7 %
Hematocrit: 39.6 % (ref 34.0–46.6)
Hemoglobin: 12.8 g/dL (ref 11.1–15.9)
Immature Grans (Abs): 0 10*3/uL (ref 0.0–0.1)
Immature Granulocytes: 0 %
Lymphocytes Absolute: 2.3 10*3/uL (ref 0.7–3.1)
Lymphs: 51 %
MCH: 26.5 pg — ABNORMAL LOW (ref 26.6–33.0)
MCHC: 32.3 g/dL (ref 31.5–35.7)
MCV: 82 fL (ref 79–97)
Monocytes Absolute: 0.4 10*3/uL (ref 0.1–0.9)
Monocytes: 9 %
Neutrophils Absolute: 1.5 10*3/uL (ref 1.4–7.0)
Neutrophils: 33 %
Platelets: 236 10*3/uL (ref 150–450)
RBC: 4.83 x10E6/uL (ref 3.77–5.28)
RDW: 13.4 % (ref 11.7–15.4)
WBC: 4.5 10*3/uL (ref 3.4–10.8)

## 2022-12-01 LAB — HEMOGLOBIN A1C
Est. average glucose Bld gHb Est-mCnc: 103 mg/dL
Hgb A1c MFr Bld: 5.2 % (ref 4.8–5.6)

## 2022-12-01 LAB — TSH: TSH: <0.005 u[IU]/mL — ABNORMAL LOW (ref 0.450–4.500)

## 2022-12-06 ENCOUNTER — Encounter (HOSPITAL_COMMUNITY): Payer: Self-pay

## 2022-12-07 ENCOUNTER — Other Ambulatory Visit: Payer: Self-pay

## 2022-12-08 ENCOUNTER — Encounter (HOSPITAL_COMMUNITY): Payer: Self-pay | Admitting: Psychiatry

## 2022-12-08 ENCOUNTER — Other Ambulatory Visit: Payer: Self-pay

## 2022-12-10 ENCOUNTER — Other Ambulatory Visit: Payer: Self-pay

## 2022-12-10 ENCOUNTER — Ambulatory Visit (INDEPENDENT_AMBULATORY_CARE_PROVIDER_SITE_OTHER): Payer: Self-pay | Admitting: Psychiatry

## 2022-12-10 DIAGNOSIS — F3130 Bipolar disorder, current episode depressed, mild or moderate severity, unspecified: Secondary | ICD-10-CM

## 2022-12-10 MED ORDER — LURASIDONE HCL 80 MG PO TABS
80.0000 mg | ORAL_TABLET | Freq: Every day | ORAL | 2 refills | Status: DC
  Filled 2023-01-05: qty 30, 30d supply, fill #0
  Filled 2023-01-29: qty 30, 30d supply, fill #1

## 2022-12-10 NOTE — Progress Notes (Signed)
BH MD/PA/NP OP Progress Note  12/10/2022 3:30 PM Tracy Keith  MRN:  [id number]  Chief Complaint:  Chief Complaint  Patient presents with   Follow-up   HPI: Tracy Keith is a 57 yr old female who presents to establish care and for medication management and therapy. PPHx is significant for Bipolar Disorder and multiple Overnight Observations (IVC's), and no reported Suicide Attempts, Self Injurious Behavior, or Psychiatric Hospitalizations.   She reports that her mood has been stable. She has been sleeping well and reports stable appetite.  She denies any manic or depressive symptoms.  Currently, she denies any suicidal ideations, homicidal ideations, auditory and visual hallucinations.  She denies paranoia. She has been tolerating Latuda well without any side effects.  She denies any irritability or aggressive behaviors.  She has been getting therapy with [facility].   She reports no change in her current stressors.  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  She is mandated by court to get mental health help after she threatened a judge last year.  She has been compliant with her medications.  She missed 1 appointment on 10/16/2022.  She is requesting a court documentation  to be signed by provider for treatment compliance.  Provider completed and signed the form. She denies drinking alcohol and denies using any illicit drugs.  She does not smoke cigarettes  She denies any other concerns.  Discussed patient's lab results.  Discussed that patient's TSH is low at <0.005.  Recommended her to follow up with PCP for further testing.  CMP was ordered but was not completed so she can get it done a PCP office when she goes for thyroid check. Patient is alert and oriented x 4,  calm, cooperative, and fully engaged in conversation during the encounter.  Her thought process is linear with coherent speech . She does not appear to be responding to internal/external stimuli .      Visit Diagnosis:    ICD-10-CM   1. Bipolar I disorder, most recent episode depressed (HCC)  F31.30 lurasidone (LATUDA) 80 MG TABS tablet       Past Psychiatric History: Bipolar Disorder and multiple Overnight Observations (IVC's), and no reported Suicide Attempts, Self Injurious Behavior, or Psychiatric Hospitalizations.    Past Medical History: No past medical history on file.  Past Surgical History:  Procedure Laterality Date   TUBAL LIGATION      Family Psychiatric History: 2 of her Sisters- Bipolar Disorder 3 of her Sisters- EtOH Abuse Mother- Suspected Mental Health Issues No Known Suicides.    Family History: No family history on file.  Social History:  Social History   Socioeconomic History   Marital status: Single    Spouse name: Not on file   Number of children: Not on file   Years of education: Not on file   Highest education level: Not on file  Occupational History   Not on file  Tobacco Use   Smoking status: Passive Smoke Exposure - Never Smoker   Smokeless tobacco: Not on file  Substance and Sexual Activity   Alcohol use: No   Drug use: No   Sexual activity: Not on file  Other Topics Concern   Not on file  Social History Narrative   Not on file   Social Determinants of Health   Financial Resource Strain: Low Risk  (09/22/2022)   Overall Financial Resource Strain (CARDIA)    Difficulty of Paying Living Expenses: Not hard at all  Food Insecurity: No Food Insecurity (09/22/2022)   Hunger Vital Sign    Worried About Running Out of Food in the Last Year: Never true    Ran Out of Food in the Last Year: Never true  Transportation Needs: No Transportation Needs (09/22/2022)   PRAPARE - Hydrologist (Medical): No    Lack of Transportation (Non-Medical): No  Physical Activity: Inactive (09/22/2022)   Exercise Vital Sign    Days of Exercise per Week: 0 days    Minutes of Exercise per Session: 0 min  Stress: No Stress  Concern Present (09/22/2022)   [place]    Feeling of Stress : Not at all  Social Connections: Socially Isolated (09/22/2022)   Social Connection and Isolation Panel [NHANES]    Frequency of Communication with Friends and Family: More than three times a week    Frequency of Social Gatherings with Friends and Family: More than three times a week    Attends Religious Services: Never    Marine scientist or Organizations: No    Attends Archivist Meetings: Never    Marital Status: Divorced    Allergies:  Allergies  Allergen Reactions   Lamictal [Lamotrigine] Swelling and Rash    Metabolic Disorder Labs: Lab Results  Component Value Date   HGBA1C 5.2 11/30/2022   No results found for: "PROLACTIN" Lab Results  Component Value Date   CHOL 197 11/30/2022   TRIG 40 11/30/2022   HDL 60 11/30/2022   CHOLHDL 3.3 11/30/2022   LDLCALC 129 (H) 11/30/2022   Lab Results  Component Value Date   TSH <0.005 (L) 11/30/2022   TSH 0.020 (L) 09/01/2010    Therapeutic Level Labs: No results found for: "LITHIUM" No results found for: "VALPROATE" No results found for: "CBMZ"  Current Medications: Current Outpatient Medications  Medication Sig Dispense Refill   lurasidone (LATUDA) 80 MG TABS tablet Take 1 tablet (80 mg total) by mouth at bedtime. 30 tablet 2   sennosides-docusate sodium (SENOKOT-S) 8.6-50 MG tablet Take 2 tablets by mouth at bedtime.     No current facility-administered medications for this visit.     Musculoskeletal: Strength & Muscle Tone: within normal limits Gait & Station: normal Patient leans: N/A  Psychiatric Specialty Exam: Review of Systems  Blood pressure 126/83, pulse 68, weight 156 lb 1 oz (70.8 kg), SpO2 100 %.Body mass index is 26.79 kg/m.  General Appearance: Fairly Groomed  Eye Contact:  Good  Speech:  Clear and Coherent and Normal Rate  Volume:  Normal  Mood:   Euthymic  Affect:  Congruent  Thought Process:  Coherent  Orientation:  Full (Time, Place, and Person)  Thought Content: Logical   Suicidal Thoughts:  No  Homicidal Thoughts:  No  Memory:  Immediate;   Good Recent;   Good  Judgement:  Fair  Insight:  Good  Psychomotor Activity:  Normal  Concentration:  Concentration: Good and Attention Span: Good  Recall:  Good  Fund of Knowledge: Good  Language: Good  Akathisia:  No  Handed:  Right  AIMS (if indicated): not done  Assets:  Communication Skills Desire for Improvement Housing Physical Health Social Support  ADL's:  Intact  Cognition: WNL  Sleep:  Good   Screenings: GAD-7    Health and safety inspector from 10/13/2022 in [facility] Office Visit from 09/02/2022 in [facility]  Total GAD-7 Score 2 11  [id number]    Flowsheet Row Counselor from 10/13/2022 in [facility] Counselor from 09/22/2022 in [facility] Office Visit from 09/02/2022 in [facility]  PHQ-2 Total Score 0 0 5  PHQ-9 Total Score 0 0 8      Flowsheet Row Counselor from 09/22/2022 in [facility] ED from 09/04/2021 in [facility] Emergency Department at [facility] ED from 04/08/2021 in [place] No Risk No Risk No Risk        Assessment and Plan: Joplin Kamerer is a 57 yr old female who presents to establish care and for medication management and therapy. PPHx is significant for Bipolar Disorder and multiple Overnight Observations (IVC's), and no reported Suicide Attempts, Self Injurious Behavior, or Psychiatric Hospitalizations.   Patient has been doing well on [place].  Denies any manic, depressive and psychotic symptoms.  Her mood has been stable on current medication.  Denies any side effects or concerns.  Patient's TSH is low  at <0.005.  Recommended her to follow up with PCP for further testing.  Treatment compliance document filled by this provider for court mandated mental health treatment.  Labs reviewed-TSH less than 0.005, CBC WNL except MCH 26.5, HbA1c 5.2, lipid profile shows LDL 129 otherwise normal.  CMP ordered but not completed.  Bipolar Disorder, current episode Depressed  GAD: -Continue Latuda 80 mg QHS for mood stability.  30 tablets with 2 refill. - CMP was ordered but was not completed so she can get it done a PCP office when she goes for thyroid check. -Continue psychotherapy with [facility].  Low TSH -Follow-up with PCP for further testing.  Follow up in 8 weeks.   Collaboration of Care: Collaboration of Care: Other None  Patient/Guardian was advised Release of Information must be obtained prior to any record release in order to collaborate their care with an outside provider. Patient/Guardian was advised if they have not already done so to contact the registration department to sign all necessary forms in order for [place] to release information regarding their care.   Consent: Patient/Guardian gives verbal consent for treatment and assignment of benefits for services provided during this visit. Patient/Guardian expressed understanding and agreed to proceed.    Armando Reichert, MD 12/10/2022, 3:30 PM

## 2023-01-05 ENCOUNTER — Other Ambulatory Visit: Payer: Self-pay

## 2023-01-05 ENCOUNTER — Ambulatory Visit (INDEPENDENT_AMBULATORY_CARE_PROVIDER_SITE_OTHER): Payer: No Payment, Other | Admitting: Clinical

## 2023-01-05 DIAGNOSIS — F3130 Bipolar disorder, current episode depressed, mild or moderate severity, unspecified: Secondary | ICD-10-CM

## 2023-01-10 NOTE — Progress Notes (Signed)
   THERAPIST PROGRESS NOTE  Session Time: 25 minutes  Participation Level: Active  Behavioral Response: CasualAlertEuthymic  Type of Therapy: Individual Therapy  Treatment Goals addressed: Client will increase coping skills to use at least 2x per week or as needed t manage depression and improve ability to perform daily acitivities  ProgressTowards Goals: Progressing  Interventions: CBT and Supportive  Summary: Tracy Keith is a 57 y.o. female who presents for the scheduled appointment oriented times five, appropriately dressed, and friendly. Client denied hallucinations and delusions. Client reported on today she has been doing well. Client reported she now has to go to court every 2 weeks instead of every week which is improvement. Client reported she works everyday which is good but the routine becomes tiring. Client reported she wants to find other activities/ hobby to do outside of work. Client reported she may go visit her daughter this weekend to get out the house. Client reported medication is working pretty well. Client reported no issues with anxiety, depression and/or irritability. Client reported the medications make her feel somewhat like a zombie or without very expressive emotions. Client reported pertaining to the topic of managing urges from negative emotions reflecting on the incident with the judge which put her in mental health court. Client reported she reflects and questions why she reacted the way she did which was out of proportion to the situation. Evidence of progress towards goal:  client is medication compliant 7 days per week. Client reported 1 goal of wanting to find a positive engaging activity.   Suicidal/Homicidal: Nowithout intent/plan  Therapist Response:  Therapist began the appointment asking the client how she has been doing. Therapist used CBT to engage using active listening and positive emotional support. Therapist used CBT to ask the client about  medication compliance and daily functioning. Therapist used CBT to discuss how to appropriately manage emotional/ behavioral urges from negative emotions. Therapist used CBT ask the client to identify her progress with frequency of use with coping skills with continued practice in her daily activity.    Therapist assigned the client homework to practice self care.    Plan: Return again in 3 weeks.  Diagnosis: bipolar 1 disorder, most recent episode, depressed  Collaboration of Care: Patient refused AEB none requested by the client.  Patient/Guardian was advised Release of Information must be obtained prior to any record release in order to collaborate their care with an outside provider. Patient/Guardian was advised if they have not already done so to contact the registration department to sign all necessary forms in order for [place] to release information regarding their care.   Consent: Patient/Guardian gives verbal consent for treatment and assignment of benefits for services provided during this visit. Patient/Guardian expressed understanding and agreed to proceed.   Makena, LCSW 01/05/2023

## 2023-01-29 ENCOUNTER — Other Ambulatory Visit (HOSPITAL_COMMUNITY): Payer: Self-pay

## 2023-01-29 ENCOUNTER — Other Ambulatory Visit: Payer: Self-pay

## 2023-02-01 ENCOUNTER — Other Ambulatory Visit: Payer: Self-pay

## 2023-02-04 ENCOUNTER — Other Ambulatory Visit: Payer: Self-pay

## 2023-02-08 ENCOUNTER — Ambulatory Visit (INDEPENDENT_AMBULATORY_CARE_PROVIDER_SITE_OTHER): Payer: No Payment, Other | Admitting: Clinical

## 2023-02-08 DIAGNOSIS — F3130 Bipolar disorder, current episode depressed, mild or moderate severity, unspecified: Secondary | ICD-10-CM | POA: Diagnosis not present

## 2023-02-08 NOTE — Progress Notes (Signed)
THERAPIST PROGRESS NOTE  Session Time: 20 minutes  Participation Level: Active  Behavioral Response: CasualAlertEuthymic  Type of Therapy: Individual Therapy  Treatment Goals addressed: client will score less than 10 on the patient health questionnaire consistently for 3 months  ProgressTowards Goals: Progressing  Interventions: CBT and Supportive  Summary:  Alasia Enge is a 57 y.o. female who presents for the scheduled appointment oriented times five, appropriately dressed and friendly. Client denied hallucinations and delusions. Client reported on today she is doing well. Client reported work has been going well and she has a good balance with own personal life. Client reported she has been doing some walking in her neighborhood and did not join the gym. Client reported her sister was telling her to be careful walking around by herself because of women being abducted. Client reported she is able to defend herself and feels safe in her neighborhood. Client reported she does not have any symptoms of depression, anxiety or irritability. Client reported in thinking with therapist about her personal motivation for continuing therapy past her court involvement she would want to explore what caused her to change mentally. Client reported it was not until 2021/22 that she had explosive issues with anger. Client reported she laughs thinking about what caused that reaction out of her. Client reported she just started medications in 2023. Client reported otherwise she spends her time with her kids when she is able to go see them. Evidence of progress towards goal:  client GAD7 and PHQ9 score is 0.     02/08/2023    3:45 PM 10/13/2022    2:33 PM 09/02/2022    8:25 AM  GAD 7 : Generalized Anxiety Score  Nervous, Anxious, on Edge 0 1 3  Control/stop worrying 0 1 2  Worry too much - different things 0 0 2  Trouble relaxing 0 0 1  Restless 0 0 2  Easily annoyed or irritable 0 0 0  Afraid -  awful might happen 0 0 1  Total GAD 7 Score 0 2 11  Anxiety Difficulty Not difficult at all Not difficult at all Not difficult at all     [facility] Counselor from 02/08/2023 in [facility]  PHQ-9 Total Score 0       Suicidal/Homicidal: Nowithout intent/plan  Therapist Response:  Therapist began the appointment asking the client how she has been doing since last seen. Therapist used CBT to engage using active listening and positive emotional support. Therapist used CBT to engage and ask the client about medication compliance. Therapist used CBT using open ended questions about any anxiety, depression and/ or irritable symptoms she is having. Therapist used CBT to engage and discuss positive behavioral activation. Therapist used CBT ask the client to identify her progress with frequency of use with coping skills with continued practice in her daily activity.    Therapist assigned the client homework to practice positive behavioral activation at least 2 days per week.    Plan: Return again in 3 weeks.  Diagnosis: bipolar 1 disorder, most recent episode depressed  Collaboration of Care: Patient refused AEB none requested by the client.  Patient/Guardian was advised Release of Information must be obtained prior to any record release in order to collaborate their care with an outside provider. Patient/Guardian was advised if they have not already done so to contact the registration department to sign all necessary forms in order for [place] to release information regarding their care.   Consent: Patient/Guardian gives verbal  consent for treatment and assignment of benefits for services provided during this visit. Patient/Guardian expressed understanding and agreed to proceed.   Neena Rhymes Coe Angelos, LCSW 02/08/2023

## 2023-02-11 ENCOUNTER — Other Ambulatory Visit: Payer: Self-pay

## 2023-02-11 ENCOUNTER — Ambulatory Visit (HOSPITAL_COMMUNITY): Payer: No Payment, Other | Admitting: Psychiatry

## 2023-02-11 ENCOUNTER — Encounter (HOSPITAL_COMMUNITY): Payer: Self-pay | Admitting: Psychiatry

## 2023-02-11 VITALS — BP 122/79 | HR 66

## 2023-02-11 DIAGNOSIS — F3130 Bipolar disorder, current episode depressed, mild or moderate severity, unspecified: Secondary | ICD-10-CM

## 2023-02-11 DIAGNOSIS — Z758 Other problems related to medical facilities and other health care: Secondary | ICD-10-CM

## 2023-02-11 MED ORDER — LURASIDONE HCL 80 MG PO TABS
80.0000 mg | ORAL_TABLET | Freq: Every day | ORAL | 2 refills | Status: DC
  Filled 2023-02-11 – 2023-03-11 (×3): qty 30, 30d supply, fill #0
  Filled 2023-04-06: qty 30, 30d supply, fill #1

## 2023-02-11 NOTE — Progress Notes (Signed)
BH MD/PA/NP OP Progress Note  02/11/2023 3:20 PM [name]  MRN:  [id number]  Chief Complaint:  Chief Complaint  Patient presents with   Follow-up   HPI: Tracy Keith is a 57 yr old female who presents to establish care and for medication management and therapy. PPHx is significant for Bipolar Disorder and multiple Overnight Observations (IVC's), and no reported Suicide Attempts, Self Injurious Behavior, or Psychiatric Hospitalizations.   She reports that her mood has been stable on current medications.  She is compliant with med management and therapy appointments.  She has been sleeping and eating well.  She denies any mood swings, manic or depressive symptoms.  Currently, she denies any suicidal ideations, homicidal ideations, auditory and visual hallucinations.  She denies paranoia.  She has been taking Latuda regularly without any side effects.  Denies any irritability or aggressive behaviors.  She has been getting therapy with [facility].   She reports no change in her current stressors.  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  She is mandated by court to get mental health help after she threatened a judge last year.  She did not miss any appointment since last med management visit on 12/10/2022.  She is requesting a court documentation  to be signed by provider for treatment compliance.  Provider completed and signed the form. She denies drinking alcohol and use of any illicit drugs.  She does not smoke cigarettes  She denies any other concerns.  Patient states that she does not have insurance so she was unable to follow-up with PCP.  Discussed the importance of getting repeat EKG and more testing for thyroid hormones.   Patient's TSH is low at <0.005.  CMP was also ordered but was not completed.  Discussed putting referral for PCP at [facility] IM resident clinic.  Patient was given phone number and she will call them to make an appointment.  Patient is alert  and oriented x 4,  calm, cooperative, and fully engaged in conversation during the encounter.  Her thought process is linear with coherent speech . She does not appear to be responding to internal/external stimuli .     Visit Diagnosis:    ICD-10-CM   1. Bipolar I disorder, most recent episode depressed  F31.30 lurasidone (LATUDA) 80 MG TABS tablet    2. Does not have primary care provider  Z75.8 Ambulatory referral to Internal Medicine       Past Psychiatric History: Bipolar Disorder and multiple Overnight Observations (IVC's), and no reported Suicide Attempts, Self Injurious Behavior, or Psychiatric Hospitalizations.    Past Medical History: No past medical history on file.  Past Surgical History:  Procedure Laterality Date   TUBAL LIGATION      Family Psychiatric History: 2 of her Sisters- Bipolar Disorder 3 of her Sisters- EtOH Abuse Mother- Suspected Mental Health Issues No Known Suicides.    Family History: No family history on file.  Social History:  Social History   Socioeconomic History   Marital status: Single    Spouse name: Not on file   Number of children: Not on file   Years of education: Not on file   Highest education level: Not on file  Occupational History   Not on file  Tobacco Use   Smoking status: Never    Passive exposure: Yes   Smokeless tobacco: Not on file  Substance and Sexual Activity   Alcohol use: No   Drug use: No  Sexual activity: Not on file  Other Topics Concern   Not on file  Social History Narrative   Not on file   Social Determinants of Health   Financial Resource Strain: Low Risk  (09/22/2022)   Overall Financial Resource Strain (CARDIA)    Difficulty of Paying Living Expenses: Not hard at all  Food Insecurity: No Food Insecurity (09/22/2022)   Hunger Vital Sign    Worried About Running Out of Food in the Last Year: Never true    Ran Out of Food in the Last Year: Never true  Transportation Needs: No Transportation  Needs (09/22/2022)   PRAPARE - Administrator, Civil Service (Medical): No    Lack of Transportation (Non-Medical): No  Physical Activity: Inactive (09/22/2022)   Exercise Vital Sign    Days of Exercise per Week: 0 days    Minutes of Exercise per Session: 0 min  Stress: No Stress Concern Present (09/22/2022)   [employer] of Occupational Health - Occupational Stress Questionnaire    Feeling of Stress : Not at all  Social Connections: Socially Isolated (09/22/2022)   Social Connection and Isolation Panel [NHANES]    Frequency of Communication with Friends and Family: More than three times a week    Frequency of Social Gatherings with Friends and Family: More than three times a week    Attends Religious Services: Never    Database administrator or Organizations: No    Attends Banker Meetings: Never    Marital Status: Divorced    Allergies:  Allergies  Allergen Reactions   Lamictal [Lamotrigine] Swelling and Rash    Metabolic Disorder Labs: Lab Results  Component Value Date   HGBA1C 5.2 11/30/2022   No results found for: "PROLACTIN" Lab Results  Component Value Date   CHOL 197 11/30/2022   TRIG 40 11/30/2022   HDL 60 11/30/2022   CHOLHDL 3.3 11/30/2022   LDLCALC 129 (H) 11/30/2022   Lab Results  Component Value Date   TSH <0.005 (L) 11/30/2022   TSH 0.020 (L) 09/01/2010    Therapeutic Level Labs: No results found for: "LITHIUM" No results found for: "VALPROATE" No results found for: "CBMZ"  Current Medications: Current Outpatient Medications  Medication Sig Dispense Refill   lurasidone (LATUDA) 80 MG TABS tablet Take 1 tablet (80 mg total) by mouth at bedtime. 30 tablet 2   sennosides-docusate sodium (SENOKOT-S) 8.6-50 MG tablet Take 2 tablets by mouth at bedtime.     No current facility-administered medications for this visit.     Musculoskeletal: Strength & Muscle Tone: within normal limits Gait & Station: normal Patient  leans: N/A  Psychiatric Specialty Exam: Review of Systems  Blood pressure 122/79, pulse 66, SpO2 100 %.There is no height or weight on file to calculate BMI.  General Appearance: Fairly Groomed  Eye Contact:  Good  Speech:  Clear and Coherent and Normal Rate  Volume:  Normal  Mood:  Euthymic  Affect:  Congruent  Thought Process:  Coherent  Orientation:  Full (Time, Place, and Person)  Thought Content: Logical   Suicidal Thoughts:  No  Homicidal Thoughts:  No  Memory:  Immediate;   Good Recent;   Good  Judgement:  Fair  Insight:  Good  Psychomotor Activity:  Normal  Concentration:  Concentration: Good and Attention Span: Good  Recall:  Good  Fund of Knowledge: Good  Language: Good  Akathisia:  No  Handed:  Right  AIMS (if indicated): not done  Assets:  Communication Skills Desire for Improvement Housing Physical Health Social Support  ADL's:  Intact  Cognition: WNL  Sleep:  Good   Screenings: GAD-7    Advertising copywriter from 02/08/2023 in [facility] Counselor from 10/13/2022 in [facility] Office Visit from 09/02/2022 in [facility]  Total GAD-7 Score 0 2 11      PHQ2-9    Flowsheet Row Counselor from 02/08/2023 in [facility] Counselor from 10/13/2022 in [facility] Counselor from 09/22/2022 in [facility] Office Visit from 09/02/2022 in [facility]  PHQ-2 Total Score 0 0 0 5  PHQ-9 Total Score 0 0 0 8      Flowsheet Row Counselor from 09/22/2022 in [facility] ED from 09/04/2021 in [facility] Emergency Department at [facility] ED from 04/08/2021 in [facility]  C-SSRS RISK CATEGORY No Risk No Risk No Risk        Assessment and Plan: Tracy Keith is a 57 yr old female who presents to  establish care and for medication management and therapy. PPHx is significant for Bipolar Disorder and multiple Overnight Observations (IVC's), and no reported Suicide Attempts, Self Injurious Behavior, or Psychiatric Hospitalizations.   Patient has been doing well on [place].  Denies any mood swings, manic, depressive and psychotic symptoms.  Her mood has been stable on current medication.  She has been compliant with current medication.  Denies any side effects or concerns.  Patient's TSH was low at <0.005 and she was recommended to follow-up with PCP at last visit.  She does not have insurance and did not follow-up with any PCP.  Will put referral for [facility] IM resident clinic. Treatment compliance document filled by this provider for court mandated mental health treatment.  Labs reviewed-TSH less than 0.005, CBC WNL except MCH 26.5, HbA1c 5.2, lipid profile shows LDL 129 otherwise normal.  CMP ordered but not completed. Last EKG in 11/22- QTc- 460.   Bipolar Disorder, current episode Depressed  GAD: -Continue Latuda 80 mg QHS for mood stability.  30 tablets with 2 refill. -CMP was ordered but was not completed so she can get it done a PCP office when she goes for thyroid check. -Continue psychotherapy with [facility]. -Recommend getting updated EKG from PCP.  Low TSH -Follow-up with PCP for further testing.  No primary care provider -Ambulatory referral for internal medicine resident clinic sent .  Follow up in 8-10 weeks.   Collaboration of Care: Collaboration of Care: Other Dr Adrian Blackwater  Patient/Guardian was advised Release of Information must be obtained prior to any record release in order to collaborate their care with an outside provider. Patient/Guardian was advised if they have not already done so to contact the registration department to sign all necessary forms in order for [place] to release information regarding their care.   Consent: Patient/Guardian gives verbal consent for  treatment and assignment of benefits for services provided during this visit. Patient/Guardian expressed understanding and agreed to proceed.    Karsten Ro, MD 02/11/2023, 3:20 PM

## 2023-03-04 ENCOUNTER — Other Ambulatory Visit: Payer: Self-pay

## 2023-03-11 ENCOUNTER — Other Ambulatory Visit: Payer: Self-pay

## 2023-03-15 ENCOUNTER — Ambulatory Visit (INDEPENDENT_AMBULATORY_CARE_PROVIDER_SITE_OTHER): Payer: No Payment, Other | Admitting: Clinical

## 2023-03-15 DIAGNOSIS — F3130 Bipolar disorder, current episode depressed, mild or moderate severity, unspecified: Secondary | ICD-10-CM | POA: Diagnosis not present

## 2023-03-19 NOTE — Progress Notes (Signed)
   THERAPIST PROGRESS NOTE  Session Time: 30 minutes  Participation Level: Active  Behavioral Response: CasualAlertEuthymic  Type of Therapy: Individual Therapy  Treatment Goals addressed: client will increase coping skills to use at least 2x per week or as needed to manage depression and improve ability to perform daily activities  ProgressTowards Goals: Progressing  Interventions: CBT and Supportive  Summary:  Birgit Bowlan is a 57 y.o. female who presents for the scheduled appointment oriented times five, appropriately dressed, and friendly. Client denied hallucinations and delusions. Client reported she is doing well today. 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 Client reported she has new clients she is going to meet with this week to see if it would be a good fit. Client reported it may require her to do third shift which she has done before but is not sure how that would work with her psych medications. Client reported she will talk to the doctor about it. Client reported she had a good mothers day and spent it with her daughter in [place]. Client reported she can have a hard time bonding with her daughter that lives in [place] because of her attitude. Client reported her daughter can have an attitude and she repeatedly wants to discuss things from her childhood which she has addressed with her. Client reported otherwise she has been stable and figuring out to spend her free time. Evidence of progress towards goal:  client has been medication compliant 7 days per week.   Suicidal/Homicidal: Nowithout intent/plan  Therapist Response:  Therapist began the appointment asking the client how she has been doing since last seen. Therapist used CBT to engage using active listening and positive emotional support. Therapist used CBT to engage and ask the client about medication compliance. Therapist used CBT to ask the client to describe  her emotions and thought process compared to stressors presenting with work. Therapist used CBT to reinforce positive behavioral activation. Therapist used CBT ask the client to identify her progress with frequency of use with coping skills with continued practice in her daily activity.      Plan: Return again in 4 weeks.  Diagnosis: bipolar 1 disorder, most recent episode depressed  Collaboration of Care: Patient refused AEB none requested by the client.  Patient/Guardian was advised Release of Information must be obtained prior to any record release in order to collaborate their care with an outside provider. Patient/Guardian was advised if they have not already done so to contact the registration department to sign all necessary forms in order for [place] to release information regarding their care.   Consent: Patient/Guardian gives verbal consent for treatment and assignment of benefits for services provided during this visit. Patient/Guardian expressed understanding and agreed to proceed.   Neena Rhymes Kazuto Sevey, LCSW 03/15/2023

## 2023-04-06 ENCOUNTER — Other Ambulatory Visit: Payer: Self-pay

## 2023-04-12 ENCOUNTER — Ambulatory Visit (INDEPENDENT_AMBULATORY_CARE_PROVIDER_SITE_OTHER): Payer: No Payment, Other | Admitting: Clinical

## 2023-04-12 DIAGNOSIS — F3130 Bipolar disorder, current episode depressed, mild or moderate severity, unspecified: Secondary | ICD-10-CM

## 2023-04-12 NOTE — Progress Notes (Signed)
   THERAPIST PROGRESS NOTE  Session Time: 25 minutes  Participation Level: Active  Behavioral Response: CasualAlertEuthymic  Type of Therapy: Individual Therapy  Treatment Goals addressed: Tracy Keith will score less than 10 on the Patient Health Questionnaire (PHQ-9) consistently for 3 months  ProgressTowards Goals: Progressing  Interventions: CBT and Supportive  Summary:  Tracy Keith is a 57 y.o. female who presents for the scheduled appointment oriented times five, appropriately dressed and friendly. Client denied hallucinations and delusions. Client reported on today she is doing pretty well. Client reported since she was last seen her job has found her another assignment. Client reported the client is during the day time but hours still need to be approved to work with the client. Client reported she would like to have her own place but living with her mother is not too bad. Client reported she is also able to help her mother. Client reported she has a simple routine of going to work and going home. Client reported she doe snot have hobbies that she takes part in for herself. Client reported no issues with depression and anxiety. Evidence of progress towards goal:  client reported 1 positive which is no anxiety and/or depressive symptoms.    04/12/2023    3:15 PM 02/08/2023    3:45 PM 10/13/2022    2:33 PM 09/02/2022    8:25 AM  GAD 7 : Generalized Anxiety Score  Nervous, Anxious, on Edge 0 0 1 3  Control/stop worrying 0 0 1 2  Worry too much - different things 0 0 0 2  Trouble relaxing 0 0 0 1  Restless 0 0 0 2  Easily annoyed or irritable 0 0 0 0  Afraid - awful might happen 0 0 0 1  Total GAD 7 Score 0 0 2 11  Anxiety Difficulty Not difficult at all Not difficult at all Not difficult at all Not difficult at all     [facility] Counselor from 04/12/2023 in [facility]  PHQ-9 Total Score 0        Suicidal/Homicidal: Nowithout  intent/plan  Therapist Response:  Therapist began the appointment asking the client how she has been doing. Therapist used cbt to engage using active listening and positive emotional support. Therapist used cbt to engage and ask the client about any anxiety and depressive symptoms and/or sources of any present. Therapist used cbt to discuss finding a hobby/ behavioral activation. Therapist used cbt to complete updated SDOH. Therapist used CBT ask the client to identify her progress with frequency of use with coping skills with continued practice in her daily activity.    Therapist assigned the client homework to practice self care.    Plan: Return again in 4 weeks.  Diagnosis: bipolar 1 disorder, most recent episode depressed  Collaboration of Care: Patient refused AEB none requested by the client.  Patient/Guardian was advised Release of Information must be obtained prior to any record release in order to collaborate their care with an outside provider. Patient/Guardian was advised if they have not already done so to contact the registration department to sign all necessary forms in order for [place] to release information regarding their care.   Consent: Patient/Guardian gives verbal consent for treatment and assignment of benefits for services provided during this visit. Patient/Guardian expressed understanding and agreed to proceed.   Neena Rhymes Tracy Piche, LCSW 04/12/2023

## 2023-05-04 ENCOUNTER — Other Ambulatory Visit: Payer: Self-pay

## 2023-05-04 ENCOUNTER — Encounter (HOSPITAL_COMMUNITY): Payer: Self-pay | Admitting: Student

## 2023-05-04 ENCOUNTER — Ambulatory Visit (INDEPENDENT_AMBULATORY_CARE_PROVIDER_SITE_OTHER): Payer: No Payment, Other | Admitting: Student

## 2023-05-04 VITALS — BP 109/74 | HR 71 | Ht 64.0 in | Wt 163.8 lb

## 2023-05-04 DIAGNOSIS — R7989 Other specified abnormal findings of blood chemistry: Secondary | ICD-10-CM

## 2023-05-04 DIAGNOSIS — F3130 Bipolar disorder, current episode depressed, mild or moderate severity, unspecified: Secondary | ICD-10-CM | POA: Diagnosis not present

## 2023-05-04 DIAGNOSIS — Z758 Other problems related to medical facilities and other health care: Secondary | ICD-10-CM | POA: Diagnosis not present

## 2023-05-04 DIAGNOSIS — E041 Nontoxic single thyroid nodule: Secondary | ICD-10-CM | POA: Diagnosis not present

## 2023-05-04 DIAGNOSIS — F411 Generalized anxiety disorder: Secondary | ICD-10-CM | POA: Insufficient documentation

## 2023-05-04 DIAGNOSIS — F3178 Bipolar disorder, in full remission, most recent episode mixed: Secondary | ICD-10-CM | POA: Insufficient documentation

## 2023-05-04 HISTORY — DX: Generalized anxiety disorder: F41.1

## 2023-05-04 MED ORDER — LURASIDONE HCL 80 MG PO TABS
80.0000 mg | ORAL_TABLET | Freq: Every day | ORAL | 1 refills | Status: DC
  Filled 2023-05-04: qty 30, 30d supply, fill #0
  Filled 2023-06-08: qty 30, 30d supply, fill #1
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
## 2023-05-04 NOTE — Patient Instructions (Signed)
[facility] Phone: [phone] Address: [address], [place], [place] [address] Hours: Monday to Friday; 8:30AM - 5PM  

## 2023-05-05 NOTE — Addendum Note (Signed)
Addended by: Theodoro Kos A on: 05/05/2023 04:50 PM   Modules accepted: Level of Service

## 2023-05-10 ENCOUNTER — Other Ambulatory Visit: Payer: Self-pay

## 2023-05-20 ENCOUNTER — Encounter (HOSPITAL_COMMUNITY): Payer: No Payment, Other | Admitting: Student

## 2023-05-24 ENCOUNTER — Other Ambulatory Visit (HOSPITAL_COMMUNITY): Payer: No Payment, Other

## 2023-06-01 ENCOUNTER — Ambulatory Visit (HOSPITAL_COMMUNITY): Payer: No Payment, Other | Admitting: Clinical

## 2023-06-01 DIAGNOSIS — F3130 Bipolar disorder, current episode depressed, mild or moderate severity, unspecified: Secondary | ICD-10-CM

## 2023-06-03 ENCOUNTER — Encounter (HOSPITAL_COMMUNITY): Payer: No Payment, Other | Admitting: Student

## 2023-06-05 NOTE — Progress Notes (Unsigned)
THERAPIST PROGRESS NOTE  Session Time: 25 minutes  Participation Level: Active  Behavioral Response: CasualAlertEuthymic  Type of Therapy: Individual Therapy  Treatment Goals addressed: Client will complete 80% of homework  ProgressTowards Goals: Progressing  Interventions: CBT and Supportive  Summary:  Genever Fissel is a 57 y.o. female who presents with a scheduled appointment oriented x 5, appropriately dressed, and friendly.  Client denied hallucinations and delusions. Client reported on today she has been doing well.  Client reported she is still waiting for another assignment from work.  Client reported the potential third shift position that was offered to her did not work out and ultimately she is to be about that.  Client reported working the third shift job would have thrown her medication regimen and her sleep schedule.  Client reported she will wait until something better comes along.  Client reported in meantime she has been working on making other healthcare appointments.  Client reported she has no complaints of other things she feels like she needs to work on particular.  Client reported she is back in communication with her eldest daughter.  Client reported her daughter questions about what she discusses at her therapy appointment.  Client reported ongoing difficulties with her relationship with her eldest daughters that if she does not discussed with her and her daughters mind oriented her how she would like she sees it as a problem.  Client reported she still has not engaged in other activity outside of the house and has been thinking about joining the [facility]. Evidence of progress towards goal: Client reported she is medication compliant 7 days a week.     06/01/2023    4:20 PM 05/04/2023    8:52 AM 04/12/2023    3:15 PM 02/08/2023    3:45 PM  GAD 7 : Generalized Anxiety Score  Nervous, Anxious, on Edge 0 1 0 0  Control/stop worrying 0 0 0 0  Worry too much - different  things 0 0 0 0  Trouble relaxing 0 1 0 0  Restless 0 1 0 0  Easily annoyed or irritable 0 0 0 0  Afraid - awful might happen 0 0 0 0  Total GAD 7 Score 0 3 0 0  Anxiety Difficulty Not difficult at all Not difficult at all Not difficult at all Not difficult at all      Suicidal/Homicidal: Nowithout intent/plan  Therapist Response:  Therapist began the appointment asking client how she has been going since last seen. Therapist used CBT to engage using active listening and positive emotional support towards her thoughts and feelings. Therapist used CBT to ask the client about medication compliance compared to ongoing symptoms. Therapist used CBT if there are any stressors present and to discuss with her how she would handle triggers and responded if she were to feel angry. Therapist completed S DOH. Therapist used CBT ask the client to identify her progress with frequency of use with coping skills with continued practice in her daily activity.    Therapist assigned client homework to potentially engage in some other physical activity aside from work.   Plan: Return again in 4 weeks.  Diagnosis: Bipolar 1 disorder most recent episode depressed  Collaboration of Care: Patient refused AEB none requested by the client.  Patient/Guardian was advised Release of Information must be obtained prior to any record release in order to collaborate their care with an outside provider. Patient/Guardian was advised if they have not already done so to contact the registration  department to sign all necessary forms in order for [place] to release information regarding their care.   Consent: Patient/Guardian gives verbal consent for treatment and assignment of benefits for services provided during this visit. Patient/Guardian expressed understanding and agreed to proceed.   Neena Rhymes Yu Cragun, LCSW 06/01/2023

## 2023-06-08 ENCOUNTER — Other Ambulatory Visit: Payer: Self-pay

## 2023-06-17 ENCOUNTER — Ambulatory Visit (INDEPENDENT_AMBULATORY_CARE_PROVIDER_SITE_OTHER): Payer: No Payment, Other | Admitting: Student

## 2023-06-17 ENCOUNTER — Encounter (HOSPITAL_COMMUNITY): Payer: Self-pay | Admitting: Student

## 2023-06-17 ENCOUNTER — Other Ambulatory Visit: Payer: Self-pay

## 2023-06-17 VITALS — BP 134/91 | HR 75 | Ht 62.6 in | Wt 169.0 lb

## 2023-06-17 DIAGNOSIS — F411 Generalized anxiety disorder: Secondary | ICD-10-CM

## 2023-06-17 DIAGNOSIS — F3178 Bipolar disorder, in full remission, most recent episode mixed: Secondary | ICD-10-CM

## 2023-06-17 MED ORDER — LURASIDONE HCL 60 MG PO TABS
60.0000 mg | ORAL_TABLET | Freq: Every day | ORAL | 1 refills | Status: DC
  Filled 2023-06-17: qty 30, 30d supply, fill #0
  Filled 2023-07-12: qty 30, 30d supply, fill #1
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
## 2023-06-29 ENCOUNTER — Encounter (HOSPITAL_COMMUNITY): Payer: No Payment, Other | Admitting: Student

## 2023-06-29 ENCOUNTER — Other Ambulatory Visit: Payer: Self-pay

## 2023-06-29 ENCOUNTER — Ambulatory Visit (HOSPITAL_COMMUNITY)
Admission: RE | Admit: 2023-06-29 | Discharge: 2023-06-29 | Disposition: A | Discharge status: Home / Self Care | Payer: Self-pay | Source: Ambulatory Visit | Attending: Internal Medicine | Admitting: Internal Medicine

## 2023-06-29 ENCOUNTER — Ambulatory Visit: Payer: Self-pay | Admitting: Student

## 2023-06-29 ENCOUNTER — Encounter: Payer: Self-pay | Admitting: Student

## 2023-06-29 VITALS — BP 128/85 | HR 76 | Temp 97.8°F | Ht 65.0 in | Wt 171.7 lb

## 2023-06-29 DIAGNOSIS — F3489 Other specified persistent mood disorders: Secondary | ICD-10-CM | POA: Insufficient documentation

## 2023-06-29 DIAGNOSIS — E041 Nontoxic single thyroid nodule: Secondary | ICD-10-CM

## 2023-06-29 DIAGNOSIS — F3178 Bipolar disorder, in full remission, most recent episode mixed: Secondary | ICD-10-CM

## 2023-06-29 DIAGNOSIS — Z23 Encounter for immunization: Secondary | ICD-10-CM | POA: Insufficient documentation

## 2023-06-29 DIAGNOSIS — Z1211 Encounter for screening for malignant neoplasm of colon: Secondary | ICD-10-CM | POA: Insufficient documentation

## 2023-06-29 DIAGNOSIS — F39 Unspecified mood [affective] disorder: Secondary | ICD-10-CM

## 2023-06-29 DIAGNOSIS — Z5320 Procedure and treatment not carried out because of patient's decision for unspecified reasons: Secondary | ICD-10-CM

## 2023-06-29 NOTE — Progress Notes (Deleted)
   CC: ***  HPI:  [name] is a 57 y.o.   History: Medical: Medications: Surgical: Family: Allergies:  Social: -Tobacco: -Alcohol: -Substance: -Lives with: -Occupation: -Support: -Functional:  Seen by Dr. Cyndie Chime of psych and had some cogwheel ridigity on 08/29 and was decreased to 60 mg of latuda  Disscuss thyroid nodule    No past medical history on file.   Current Outpatient Medications:    Lurasidone HCl (LATUDA) 60 MG TABS, Take 1 tablet (60 mg total) by mouth at bedtime., Disp: 30 tablet, Rfl: 1   sennosides-docusate sodium (SENOKOT-S) 8.6-50 MG tablet, Take 2 tablets by mouth at bedtime., Disp: , Rfl:   Review of Systems:  ***  Constitutional: Eye: Respiratory: Cardiovascular: GI: MSK: GU: Skin: Neuro: Endocrine:   Physical Exam:  There were no vitals filed for this visit. *** General: Patient is sitting comfortably in the room  Eyes: Pupils equal and reactive to light, EOM intact  Head: Normocephalic, atraumatic  Neck: Supple, nontender, full range of motion, No JVD Cardio: Regular rate and rhythm, no murmurs, rubs or gallops. 2+ pulses to bilateral upper and lower extremities  Chest: No chest tenderness Pulmonary: Clear to ausculation bilaterally with no rales, rhonchi, and crackles  Abdomen: Soft, nontender with normoactive bowel sounds with no rebound or guarding  Neuro: Alert and orientated x3. CN II-XII intact. Sensation intact to upper and lower extremities. 2+ patellar reflex.  Back: No midline tenderness, no step off or deformities noted. No paraspinal muscle tenderness.  Skin: No rashes noted  MSK: 5/5 strength to upper and lower extremities.    Assessment & Plan:   No problem-specific Assessment & Plan notes found for this encounter.    Patient {GC/GE:[id number]::"discussed with","seen with"} Dr. {NAMES:[id number]::"Guilloud","Hoffman","Mullen","Narendra","Williams","Vincent"}  Modena Slater, DO PGY-2 Internal Medicine  Resident  Pager: [phone]

## 2023-06-29 NOTE — Assessment & Plan Note (Addendum)
>>  ASSESSMENT AND PLAN FOR UNSPECIFED BIPOLAR DISORDER, IN FULL REMISSION, MOST RECENT EPISODE MIXED (HCC) WRITTEN ON 06/29/2023 10:43 PM BY Yanessa Hocevar, DO  Patient is on latuda. Followed by the psychiatry residency program. Given some concern of QT prolongation will obtain EKG. Did do EKG today and there is no evidence of Qtc prolongation on EKG today.   >>ASSESSMENT AND PLAN FOR EPISODIC MOOD DISORDER (HCC) WRITTEN ON 06/29/2023 10:45 PM BY Emrys Mckamie, DO  Please see bipolar disorder plan. EKG done, and within normal limits.

## 2023-06-29 NOTE — Assessment & Plan Note (Signed)
Patient has an unclear thyroid etiology that has not been fully worked up. Patient was found to have right thyroid nodule on work up back in 2011. Unclear what this was and unclear why it was never followed up on. Patient has had fragmented care and therefore was never fully diagnosed. She currently does endorse hair loss but denies any hot flashes, diarrhea, neck masses, and palpitations. On exam I do not feel any goiters or any neck masses. I do not appreciate any thyroid mass. I will initiate a full thyroid work up. Most recent TSH was very low, and it could be either hyperthyroidism vs tertiary hypothyroidism.   Plan: -Follow up TSH, Free T4, and T3 -Follow up thyroid ultrasound  -if there is some etiology of hyperthyroid, will need to discuss about methimazole and a radioactive iodine uptake exam

## 2023-06-29 NOTE — Patient Instructions (Addendum)
Tracy Keith you for allowing me to take part in your care today.  Here are your instructions.  1. For your thyroid, I am going to test your thyroid to see if it is over functioning or not functioning enough. Please await for my phone call for the results. Please go to the [facility] center to make sure that you get your labs. Say you want only the labs from Dr. Allena Katz.   2. We gave you a flu shot today.  3. Your EKG was normal today. Keep taking your Latuda  4. I am going to refer you for a thyroid ultrasound. Please wait for phone call to schedule.   5. We will have to wait for you to get the colonoscopy until you get insurance.   Thank you, Dr. Allena Katz  If you have any other questions please contact the internal medicine clinic at [phone]

## 2023-06-29 NOTE — Assessment & Plan Note (Signed)
Patient declined mammogram today.

## 2023-06-29 NOTE — Assessment & Plan Note (Signed)
Please see bipolar disorder plan. EKG done, and within normal limits.

## 2023-06-29 NOTE — Assessment & Plan Note (Signed)
Patient reports that she would like a colonoscopy. Given patient does not have insurance yet, will wait for insurance to activate before referring.

## 2023-06-29 NOTE — Assessment & Plan Note (Signed)
Administered flu vaccination today

## 2023-06-29 NOTE — Progress Notes (Signed)
   CC:  Establish care   HPI:  [name] is a 57 y.o. female who presents to establish care. Please see assessment and plan for full HPI.   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   Current Outpatient Medications:    Lurasidone HCl (LATUDA) 60 MG TABS, Take 1 tablet (60 mg total) by mouth at bedtime., Disp: 30 tablet, Rfl: 1   sennosides-docusate sodium (SENOKOT-S) 8.6-50 MG tablet, Take 2 tablets by mouth at bedtime., Disp: , Rfl:   Review of Systems:    Negative except what is stated in the HPI  Physical Exam:  Vitals:   06/29/23 1039  BP: 128/85  Pulse: 76  Temp: 97.8 F (36.6 C)  TempSrc: Oral  SpO2: 98%  Weight: 171 lb 11.2 oz (77.9 kg)  Height: 5\' 5"  (1.651 m)    General: Patient is sitting comfortably in the room  Head: Normocephalic, atraumatic  Neck: Supple, nontender, no masses, no goiter Cardio: Regular rate and rhythm, no murmurs, rubs or gallops Pulmonary: Clear to ausculation bilaterally with no rales, rhonchi, and crackles   Assessment & Plan:   Thyroid nodule Patient has an unclear thyroid etiology that has not been fully worked up. Patient was found to have right thyroid nodule on work up back in 2011. Unclear what this was and unclear why it was never followed up on. Patient has had fragmented care and therefore was never fully diagnosed. She currently does endorse hair loss but denies any hot flashes, diarrhea, neck masses, and palpitations. On exam I do not feel any goiters or any neck masses. I do not appreciate any thyroid mass. I will initiate a full thyroid work up. Most recent TSH was  very low, and it could be either hyperthyroidism vs tertiary hypothyroidism.   Plan: -Follow up TSH, Free T4, and T3 -Follow up thyroid ultrasound  -if there is some etiology of hyperthyroid, will need to discuss about methimazole and a radioactive iodine uptake exam   Unspecifed bipolar disorder, in full remission, most recent episode mixed [facility]) Patient is on latuda. Followed by the psychiatry residency program. Given some concern of QT prolongation will obtain EKG. Did do EKG today and there is no evidence of Qtc prolongation on EKG today.  Mammogram declined Patient declined mammogram today.   Flu vaccine need Administered flu vaccination today   Episodic mood disorder (HCC) Please see bipolar disorder plan. EKG done, and within normal limits.   Screening for colon cancer Patient reports that she would like a colonoscopy. Given patient does not have insurance yet, will wait for insurance to activate before referring.    Patient discussed with Dr. Mercie Eon  Modena Slater, DO PGY-2 Internal Medicine Resident  Pager: [phone]

## 2023-06-30 NOTE — Addendum Note (Signed)
Addended by: Derrek Monaco on: 06/30/2023 01:07 PM   Modules accepted: Level of Service

## 2023-06-30 NOTE — Progress Notes (Signed)
Internal Medicine Clinic Attending  Case discussed with the resident at the time of the visit.  We reviewed the resident's history and exam and pertinent patient test results.  I agree with the assessment, diagnosis, and plan of care documented in the resident's note.  

## 2023-07-12 ENCOUNTER — Other Ambulatory Visit: Payer: Self-pay

## 2023-07-13 ENCOUNTER — Ambulatory Visit (INDEPENDENT_AMBULATORY_CARE_PROVIDER_SITE_OTHER): Payer: No Payment, Other | Admitting: Clinical

## 2023-07-13 ENCOUNTER — Other Ambulatory Visit: Payer: Self-pay

## 2023-07-13 ENCOUNTER — Ambulatory Visit (HOSPITAL_COMMUNITY): Payer: No Payment, Other | Admitting: Clinical

## 2023-07-13 DIAGNOSIS — F3178 Bipolar disorder, in full remission, most recent episode mixed: Secondary | ICD-10-CM | POA: Diagnosis not present

## 2023-07-13 NOTE — Progress Notes (Unsigned)
   THERAPIST PROGRESS NOTE  Session Time: 20 minutes  Participation Level: Active  Behavioral Response: CasualAlertEuthymic  Type of Therapy: Individual Therapy  Treatment Goals addressed: Client will increase use of coping skills to use at least 2 times per week or as needed to manage depression/anxiety and improve ability to perform daily activities  ProgressTowards Goals: Progressing  Interventions: CBT and Supportive  Summary:  Tracy Keith is a 57 y.o. female who presents for the scheduled appointment oriented x 5, appropriately dressed, and friendly. Client denied hallucinations and delusions. Client reported on today she is doing well. Client reported while she is waiting for her job to give her more full-time patient's she has been going to her own health appointments. Client reported she has been going to the internal medicine doctor in regards to her thyroid.  Client reported they may possibly do a biopsy on a nodule that had been present several years ago which they found to be noncancerous. Client reported she met with the psychiatrist and her Kasandra Knudsen was decreased. Client reported she feels better and not so much of a "zombie". Client reported she is looking for other opportunities for work until her job can place her somewhere. Client reported she was talking to eldest daughter and her birthday is coming up. Client reported they still go in between talking and not talking. Client reported her daughter has been talking to her about planning ahead for her future goals so she has been doing that.  Evidence of progress towards goal:  client reported she has been medication compliant 7 days per week.    Suicidal/Homicidal: Nowithout intent/plan  Therapist Response:  Therapist began the appointment asking the client how she has been doing since last seen. Therapist used CBT to engage using active listening and positive emotional support. Therapist used CBT to engage and ask the  client about response to medication management.  Therapist used CBT to ask the client about any changes that have occurred within her family and/or work. Therapist used CBT to positively reinforce the clients initiated to to work on meeting her personal goals. Therapist used CBT ask the client to identify her progress with frequency of use with coping skills with continued practice in her daily activity.    Therapist assigned client homework to practice self-care.   Plan: Return again in 4 weeks.  Diagnosis: Bipolar disorder, in full remission, most recent episode mixed  Collaboration of Care: Patient refused AEB none requested by the client.  Patient/Guardian was advised Release of Information must be obtained prior to any record release in order to collaborate their care with an outside provider. Patient/Guardian was advised if they have not already done so to contact the registration department to sign all necessary forms in order for [place] to release information regarding their care.   Consent: Patient/Guardian gives verbal consent for treatment and assignment of benefits for services provided during this visit. Patient/Guardian expressed understanding and agreed to proceed.   Neena Rhymes Lenardo Westwood, LCSW 07/13/2023

## 2023-07-15 ENCOUNTER — Ambulatory Visit (HOSPITAL_COMMUNITY)
Admission: RE | Admit: 2023-07-15 | Discharge: 2023-07-15 | Disposition: A | Discharge status: Home / Self Care | Payer: Self-pay | Source: Ambulatory Visit | Attending: Internal Medicine | Admitting: Internal Medicine

## 2023-07-15 DIAGNOSIS — E041 Nontoxic single thyroid nodule: Secondary | ICD-10-CM | POA: Insufficient documentation

## 2023-07-16 ENCOUNTER — Other Ambulatory Visit: Payer: Self-pay

## 2023-07-16 LAB — T4, FREE: Free T4: 1.89 ng/dL — ABNORMAL HIGH (ref 0.82–1.77)

## 2023-07-16 LAB — TSH: TSH: <0.005 u[IU]/mL — ABNORMAL LOW (ref 0.450–4.500)

## 2023-07-16 LAB — T3: T3, Total: 189 ng/dL — ABNORMAL HIGH (ref 71–180)

## 2023-07-16 MED ORDER — METHIMAZOLE 5 MG PO TABS
5.0000 mg | ORAL_TABLET | Freq: Every day | ORAL | 2 refills | Status: DC
  Filled 2023-07-16: qty 30, 30d supply, fill #0
  Filled 2023-08-10: qty 30, 30d supply, fill #1
  Filled 2023-09-17: qty 30, 30d supply, fill #2

## 2023-07-16 NOTE — Addendum Note (Signed)
Addended by: Modena Slater on: 07/16/2023 09:15 AM   Modules accepted: Orders

## 2023-07-19 ENCOUNTER — Telehealth: Payer: Self-pay | Admitting: Student

## 2023-07-19 DIAGNOSIS — E041 Nontoxic single thyroid nodule: Secondary | ICD-10-CM

## 2023-07-19 NOTE — Telephone Encounter (Signed)
Patient recently had thyroid ultrasound.  Patient had 4 nodules.  1 of which needs a further workup with biopsy.  Will refer patient to interventional radiology for biopsy.  I did call the patient and informed her of the results.  Patient states she will go get her thyroid biopsied whenever she is scheduled for this.

## 2023-07-22 ENCOUNTER — Ambulatory Visit (INDEPENDENT_AMBULATORY_CARE_PROVIDER_SITE_OTHER): Payer: No Payment, Other | Admitting: Student

## 2023-07-22 ENCOUNTER — Other Ambulatory Visit: Payer: Self-pay

## 2023-07-22 DIAGNOSIS — F411 Generalized anxiety disorder: Secondary | ICD-10-CM

## 2023-07-22 DIAGNOSIS — F3178 Bipolar disorder, in full remission, most recent episode mixed: Secondary | ICD-10-CM | POA: Diagnosis not present

## 2023-07-22 MED ORDER — SENNA-DOCUSATE SODIUM 8.6-50 MG PO TABS
1.0000 | ORAL_TABLET | Freq: Every evening | ORAL | 2 refills | Status: DC
  Filled 2023-07-22: qty 60, 30d supply, fill #0

## 2023-07-22 MED ORDER — LURASIDONE HCL 60 MG PO TABS
60.0000 mg | ORAL_TABLET | Freq: Every day | ORAL | 2 refills | Status: DC
  Filled 2023-07-22 – 2023-08-10 (×2): qty 30, 30d supply, fill #0
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
## 2023-07-29 ENCOUNTER — Encounter: Payer: Self-pay | Admitting: Student

## 2023-07-30 ENCOUNTER — Encounter (HOSPITAL_COMMUNITY): Payer: Self-pay

## 2023-08-09 ENCOUNTER — Ambulatory Visit (INDEPENDENT_AMBULATORY_CARE_PROVIDER_SITE_OTHER): Payer: No Payment, Other | Admitting: Clinical

## 2023-08-09 DIAGNOSIS — F3178 Bipolar disorder, in full remission, most recent episode mixed: Secondary | ICD-10-CM | POA: Diagnosis not present

## 2023-08-10 ENCOUNTER — Other Ambulatory Visit: Payer: Self-pay

## 2023-08-10 NOTE — Progress Notes (Signed)
   THERAPIST PROGRESS NOTE  Session Time: 30 minutes  Participation Level: Active  Behavioral Response: CasualAlertEuthymic  Type of Therapy: Individual Therapy  Treatment Goals addressed: Heatherly will identify situations, thoughts, and feelings that trigger internal anger, and/or angry/aggressive actions as evidenced by self-report 1x per session  ProgressTowards Goals: Progressing  Interventions: CBT and Supportive  Summary:  Tracy Keith is a 57 y.o. female who presents for the scheduled appointment oriented times five, appropriately dressed and friendly. Client denied hallucinations and delusions. Client reported on today she has been doing pretty well. Client reported she was not able to join her daughter for her birthday but heard she had a good time. Client reported her birthday is coming up and she does not have plans yet. Client reported she has been spending most of her time at home. Client reported she is not depressed but does feel anxious and on edge. Client reported her job has found her a client in [place] but she does not feel comfortable driving on the highway. Client reported she use to love driving but for some reason that has changed. Client reported she gets anxious with not having anything to do. Client reported she still needs to look into joining her local [employer] for activity. Client reported the psychiatrist lowered her dosage to 60mg . Client reported she does not feel as sedated and likes where it is at. Client reported she does have some reservations about it being lowered again of how it would affect her mood.  Evidence of progress towards goal:  client is medication compliant 7 days per week.  Suicidal/Homicidal: Nowithout intent/plan  Therapist Response:  Therapist began the appointment asking the client how she has been doing. Therapist used cbt to engage using active listening and positive emotional support. Therapist used cbt to engage and ask the client  how well she is adjusting to her medications. Therapist used cbt to ask the client clarifying questions about her mood and processing her thoughts differently towards situations that may be triggering. Therapist used Cbt to discuss emtion regulation techniques. Therapist used CBT ask the client to identify her progress with frequency of use with coping skills with continued practice in her daily activity.    Therapist assigned the client homework to pick an activity to keep her busy during the week.    Plan: Return again in 4 weeks.  Diagnosis: bipolar disorder, in full remission, most recent episode mixed  Collaboration of Care: Patient refused AEB none requested.  Patient/Guardian was advised Release of Information must be obtained prior to any record release in order to collaborate their care with an outside provider. Patient/Guardian was advised if they have not already done so to contact the registration department to sign all necessary forms in order for [place] to release information regarding their care.   Consent: Patient/Guardian gives verbal consent for treatment and assignment of benefits for services provided during this visit. Patient/Guardian expressed understanding and agreed to proceed.   Neena Rhymes Kaiel Weide, LCSW 08/09/2023

## 2023-08-11 ENCOUNTER — Other Ambulatory Visit: Payer: Self-pay

## 2023-08-13 ENCOUNTER — Other Ambulatory Visit: Payer: Self-pay

## 2023-08-20 ENCOUNTER — Encounter: Payer: Self-pay | Admitting: Student

## 2023-08-31 ENCOUNTER — Ambulatory Visit (INDEPENDENT_AMBULATORY_CARE_PROVIDER_SITE_OTHER): Payer: No Payment, Other | Admitting: Clinical

## 2023-08-31 DIAGNOSIS — F3178 Bipolar disorder, in full remission, most recent episode mixed: Secondary | ICD-10-CM | POA: Diagnosis not present

## 2023-08-31 NOTE — Progress Notes (Signed)
   THERAPIST PROGRESS NOTE  Session Time: 25 minutes  Participation Level: Active  Behavioral Response: CasualAlertEuthymic  Type of Therapy: Individual Therapy  Treatment Goals addressed:  Increase coping skills to use at least 2x per week or as needed to manage depression and improve ability to perform daily activities   ProgressTowards Goals: Progressing  Interventions: CBT and Supportive  Summary:  BRITTA BURGOON is a 57 y.o. female who presents for the scheduled appointment oriented times five, appropriately dressed and friendly. Client denied hallucinations and delusions. Client reported she has been doing okay. Client reported she celebrated her birthday with her daughter. Client reported she has some concerns about her mother. Client reported her mother stated she wants her out of the house but not for a bad reason. Client reported she thinks her mother may have dementia and can't live by herself. 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 Client reported she does have concern about her medication with working third shift potentially but will talk with the psychiatrist about it. Client reported she has a brother at home with her and mother so she could have help with looking after her mother if she does decide to take a third shift position. Client reported she has a lot to figure out moving forward. Client reported she has been doing well on the medication and the psychiatrist is going to decrease her dosage again at her next appointment. Evidence of progress towards goal:  client reported medication compliance 7 days per week. Client reported 1 source of anxiety attributed to not working at the moment.   Suicidal/Homicidal: Nowithout intent/plan  Therapist Response:  Therapist began the appointment asking the client how she has been doing since last seen. Therapist used CBT to engage using active  listening and positive emotional support. Therapist used CBT to ask client about her emotional and thought process compared to her medication regimen adjustment. Therapist used CBT to ask client to identify Personal stressors that are contributing to any negative symptoms that she is experiencing. Therapist used CBT to continue teaching the client about being mindful of identifying any triggers that could impact her mood at any point in time to discuss some therapy sessions as well as encouraged her to continue to be engaged with her family support. Therapist used CBT ask the client to identify her progress with frequency of use with coping skills with continued practice in her daily activity.       Plan: Return again in 4 weeks.  Diagnosis: bipolar disorder, in full remission, most recent episode mixed  Collaboration of Care: Patient refused AEB none requested.  Patient/Guardian was advised Release of Information must be obtained prior to any record release in order to collaborate their care with an outside provider. Patient/Guardian was advised if they have not already done so to contact the registration department to sign all necessary forms in order for [place] to release information regarding their care.   Consent: Patient/Guardian gives verbal consent for treatment and assignment of benefits for services provided during this visit. Patient/Guardian expressed understanding and agreed to proceed.   Neena Rhymes Quang Thorpe, LCSW 08/31/2023

## 2023-09-07 ENCOUNTER — Telehealth (HOSPITAL_COMMUNITY): Payer: Self-pay | Admitting: Student

## 2023-09-08 ENCOUNTER — Other Ambulatory Visit: Payer: Self-pay

## 2023-09-08 ENCOUNTER — Other Ambulatory Visit (HOSPITAL_COMMUNITY): Payer: Self-pay | Admitting: Student

## 2023-09-08 DIAGNOSIS — F411 Generalized anxiety disorder: Secondary | ICD-10-CM

## 2023-09-08 DIAGNOSIS — F3178 Bipolar disorder, in full remission, most recent episode mixed: Secondary | ICD-10-CM

## 2023-09-08 MED ORDER — LURASIDONE HCL 60 MG PO TABS
60.0000 mg | ORAL_TABLET | Freq: Every day | ORAL | 2 refills | Status: DC
  Filled 2023-09-08: qty 30, 30d supply, fill #0

## 2023-09-09 ENCOUNTER — Other Ambulatory Visit: Payer: Self-pay

## 2023-09-13 ENCOUNTER — Ambulatory Visit (INDEPENDENT_AMBULATORY_CARE_PROVIDER_SITE_OTHER): Payer: Self-pay | Admitting: Student

## 2023-09-13 ENCOUNTER — Other Ambulatory Visit: Payer: Self-pay

## 2023-09-13 ENCOUNTER — Encounter: Payer: Self-pay | Admitting: Student

## 2023-09-13 VITALS — BP 109/79 | HR 81 | Temp 97.9°F | Wt 171.1 lb

## 2023-09-13 DIAGNOSIS — F3178 Bipolar disorder, in full remission, most recent episode mixed: Secondary | ICD-10-CM

## 2023-09-13 DIAGNOSIS — Z5320 Procedure and treatment not carried out because of patient's decision for unspecified reasons: Secondary | ICD-10-CM

## 2023-09-13 DIAGNOSIS — Z1211 Encounter for screening for malignant neoplasm of colon: Secondary | ICD-10-CM

## 2023-09-13 DIAGNOSIS — H5789 Other specified disorders of eye and adnexa: Secondary | ICD-10-CM | POA: Insufficient documentation

## 2023-09-13 DIAGNOSIS — E041 Nontoxic single thyroid nodule: Secondary | ICD-10-CM

## 2023-09-13 NOTE — Assessment & Plan Note (Addendum)
Offered patient a colonoscopy, but she declined at this time.  She states she tried in the past but without insurance was unable to get one.

## 2023-09-13 NOTE — Assessment & Plan Note (Signed)
Patient declined a mammogram at this time.

## 2023-09-13 NOTE — Assessment & Plan Note (Addendum)
She a history of hyperthyroidism.  Thyroid ultrasound done recently showed 4 nodules, 3 had benign features.  1 nodule in the mid right lobe is appropriate for FNA biopsy.  A referral was placed in September but she has not been contacted about this. Apparently the referral placed was not the correct one, so a new referral will be placed today.   She reports one episode of diarrhea weekly but denies any other new symptoms consistent with hyperthyroidism.   She states that she took her methimazole for 2 weeks but her right eye started getting irritated, which she thought was due to the medication, so she stopped taking it.  She restarted it about a week ago.  Since she has not been taking the methimazole regularly, her thyroid labs would not be an accurate representation, so we will defer them until her follow-up visit in 1 month.  I spoke with her about getting the TSH receptor antibodies to check for Graves' disease today, but she declined at this time due to cost.   I instructed her to continue taking methimazole for 1 month and use saline eyedrops as needed for her eye irritation.  If it continues to worsen, I told her to call the clinic and we can consider switching to PTU.   Most importantly, I provided her with Medicaid paperwork and encouraged her to apply this week, as this will likely be the biggest barrier to her receiving the health care she needs.

## 2023-09-13 NOTE — Assessment & Plan Note (Addendum)
Patient continues to take Latuda 60 mg daily at bedtime.  No complaints at this time.  Follows regularly with a psychiatrist. Next appointment is on 11/27.

## 2023-09-13 NOTE — Progress Notes (Signed)
CC: thyroid follow up  HPI:  [name] is a 57 y.o. female living with a history stated below and presents today for follow up of hyperthyroidism. Please see problem based assessment and plan for additional details.  PMH: thyroid nodules, hyperthyroidism, bipolar disorder in remission  Current Outpatient Medications on File Prior to Visit  Medication Sig Dispense Refill   Lurasidone HCl (LATUDA) 60 MG TABS Take 1 tablet (60 mg total) by mouth at bedtime. 30 tablet 2   methimazole (TAPAZOLE) 5 MG tablet Take 1 tablet (5 mg total) by mouth daily. 30 tablet 2   sennosides-docusate sodium (SENOKOT-S) 8.6-50 MG tablet Take 1-2 tablets by mouth at bedtime. 60 tablet 2   No current facility-administered medications on file prior to visit.   FH: History reviewed. No pertinent family history.  Social History   Socioeconomic History   Marital status: Single    Spouse name: Not on file   Number of children: Not on file   Years of education: Not on file   Highest education level: Not on file  Occupational History   Not on file  Tobacco Use   Smoking status: Never    Passive exposure: Yes   Smokeless tobacco: Not on file  Substance and Sexual Activity   Alcohol use: No   Drug use: No   Sexual activity: Not on file  Other Topics Concern   Not on file  Social History Narrative   Not on file   Social Determinants of Health   Financial Resource Strain: Low Risk  (09/22/2022)   Overall Financial Resource Strain (CARDIA)    Difficulty of Paying Living Expenses: Not hard at all  Food Insecurity: No Food Insecurity (09/13/2023)   Hunger Vital Sign    Worried About Running Out of Food in the Last Year: Never true    Ran Out of Food in the Last Year: Never true  Transportation Needs: No Transportation Needs (09/13/2023)   PRAPARE - Administrator, Civil Service (Medical): No    Lack of Transportation (Non-Medical): No  Physical Activity: Inactive (09/22/2022)    Exercise Vital Sign    Days of Exercise per Week: 0 days    Minutes of Exercise per Session: 0 min  Stress: No Stress Concern Present (09/22/2022)   [employer] of Occupational Health - Occupational Stress Questionnaire    Feeling of Stress : Not at all  Social Connections: Socially Isolated (09/22/2022)   Social Connection and Isolation Panel [NHANES]    Frequency of Communication with Friends and Family: More than three times a week    Frequency of Social Gatherings with Friends and Family: More than three times a week    Attends Religious Services: Never    Database administrator or Organizations: No    Attends Banker Meetings: Never    Marital Status: Divorced  Catering manager Violence: Not At Risk (09/13/2023)   Humiliation, Afraid, Rape, and Kick questionnaire    Fear of Current or Ex-Partner: No    Emotionally Abused: No    Physically Abused: No    Sexually Abused: No   Review of Systems: ROS negative except for what is noted on the assessment and plan.  Vitals:   09/13/23 0909  BP: 109/79  Pulse: 81  Temp: 97.9 F (36.6 C)  TempSrc: Oral  SpO2: 100%  Weight: 171 lb 1.6 oz (77.6 kg)   Physical Exam: Constitutional: well appearing HENT: unremarkable Eyes: injected sclera on right  half of right eye, occasional itching; no discharge. L eye normal.  Cardiovascular: regular rate and rhythm, no m/r/g Pulmonary/Chest: CTA bilaterally, normal respiratory effort Neurological: alert & oriented x 3, no focal deficits Skin: warm and dry Psych: normal mood and behavior  Assessment & Plan:   Patient seen with Dr. Lafonda Mosses  Thyroid nodule She a history of hyperthyroidism.  Thyroid ultrasound done recently showed 4 nodules, 3 had benign features.  1 nodule in the mid right lobe is appropriate for FNA biopsy.  A referral was placed in September but she has not been contacted about this. Apparently the referral placed was not the correct one, so a new referral  will be placed today.   She reports one episode of diarrhea weekly but denies any other new symptoms consistent with hyperthyroidism.   She states that she took her methimazole for 2 weeks but her right eye started getting irritated, which she thought was due to the medication, so she stopped taking it.  She restarted it about a week ago.  Since she has not been taking the methimazole regularly, her thyroid labs would not be an accurate representation, so we will defer them until her follow-up visit in 1 month.  I spoke with her about getting the TSH receptor antibodies to check for Graves' disease today, but she declined at this time due to cost.   I instructed her to continue taking methimazole for 1 month and use saline eyedrops as needed for her eye irritation.  If it continues to worsen, I told her to call the clinic and we can consider switching to PTU.   Most importantly, I provided her with Medicaid paperwork and encouraged her to apply this week, as this will likely be the biggest barrier to her receiving the health care she needs.  Unspecifed bipolar disorder, in full remission, most recent episode mixed [facility]) Patient continues to take Latuda 60 mg daily at bedtime.  No complaints at this time.  Follows regularly with a psychiatrist. Next appointment is on 11/27.  Screening for colon cancer Offered patient a colonoscopy, but she declined at this time.  She states she tried in the past but without insurance was unable to get one.  Mammogram declined Patient declined a mammogram at this time.  Annett Fabian, MD  [facility] Internal Medicine, PGY-1 Phone: [phone] Date 09/13/2023 Time 10:12 AM

## 2023-09-13 NOTE — Patient Instructions (Addendum)
Thank you, Ms.Warnell Forester Labreck for allowing [place] to provide your care today. Today we discussed your hyperthyroidism.    Please continue taking methimazole for the next month.  We will have a follow-up appointment in 1 month and we will recheck your thyroid labs at that time.  I have ordered a thyroid biopsy and they should be contacting you shortly about scheduling that.  For your eye, I recommend purchasing some saline eyedrops over-the-counter.  If your irritation worsens a or does not improve, please give [place] a call.  As we discussed, please work on applying for Medicaid, as this will be important for getting the healthcare you need.   Follow up: 1 month  We look forward to seeing you next time. Please call our clinic at [phone] if you have any questions or concerns. The best time to call is Monday-Friday from 9am-4pm, but there is someone available 24/7. If after hours or the weekend, call the main hospital number and ask for the Internal Medicine Resident On-Call. If you need medication refills, please notify your pharmacy one week in advance and they will send [place] a request.   Thank you for trusting me with your care. Wishing you the best!   Annett Fabian, MD [facility]

## 2023-09-14 NOTE — Progress Notes (Signed)
Internal Medicine Clinic Attending  I was physically present during the key portions of the resident provided service and participated in the medical decision making of patient's management care. I reviewed pertinent patient test results.  The assessment, diagnosis, and plan were formulated together and I agree with the documentation in the resident's note.  Mercie Eon, MD   During patient's first visit with this clinic, she reported a history of hyperthyroidism and of thyroid nodules.   For her hyperthyroidism, she is currently asymptomatic. No palpable nodules or goiter on exam. Labs showed low TSH & high free T4, consistent with hyperthyroidism, so we prescribed methimazole 5mg  daily at last visit. She has not been taking this consistently, so I agree with not repeating thyroid function tests today. Continue methimazole 5mg  daily.   For her thyroid nodules, we placed new referral for FNA today.   She will apply for Medicaid

## 2023-09-14 NOTE — Addendum Note (Signed)
Addended by: Derrek Monaco on: 09/14/2023 09:10 AM   Modules accepted: Level of Service

## 2023-09-15 ENCOUNTER — Encounter (HOSPITAL_COMMUNITY): Payer: Self-pay | Admitting: Student

## 2023-09-15 NOTE — Telephone Encounter (Signed)
Unable to get in contact, left voicemail.

## 2023-09-17 ENCOUNTER — Other Ambulatory Visit: Payer: Self-pay

## 2023-09-21 ENCOUNTER — Encounter (HOSPITAL_COMMUNITY): Payer: No Payment, Other | Admitting: Student

## 2023-09-22 ENCOUNTER — Other Ambulatory Visit: Payer: Self-pay

## 2023-09-22 ENCOUNTER — Ambulatory Visit (INDEPENDENT_AMBULATORY_CARE_PROVIDER_SITE_OTHER): Payer: No Payment, Other | Admitting: Student

## 2023-09-22 DIAGNOSIS — F3178 Bipolar disorder, in full remission, most recent episode mixed: Secondary | ICD-10-CM | POA: Diagnosis not present

## 2023-09-22 DIAGNOSIS — F411 Generalized anxiety disorder: Secondary | ICD-10-CM

## 2023-09-22 MED ORDER — LURASIDONE HCL 60 MG PO TABS
60.0000 mg | ORAL_TABLET | Freq: Every day | ORAL | 0 refills | Status: DC
  Filled 2023-09-22: qty 90, 90d supply, fill #0
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
## 2023-09-24 ENCOUNTER — Other Ambulatory Visit: Payer: Self-pay

## 2023-10-05 ENCOUNTER — Ambulatory Visit (HOSPITAL_COMMUNITY): Payer: No Payment, Other | Admitting: Clinical

## 2023-10-11 ENCOUNTER — Encounter: Payer: Self-pay | Admitting: Student

## 2023-10-29 ENCOUNTER — Encounter: Payer: Self-pay | Admitting: *Deleted

## 2023-10-29 NOTE — Progress Notes (Signed)
 Pt attended 09/17/2023 screening event where her b/p was 139/88. At the event, the pt noted a PCP Patel and no insurance, documented not smoking and did not identify any SDOH insecurities. Chart review confirms pt 's assigned PCP is Dr. Libby Blanch at the [facility], whom pt saw on 06/29/23, when her B/P was 128/85. Pt also saw PCP clinic on 09/13/23 where her b/p was 109/79. PCP clinic notes pt has ongoing thryroid issues that were being investigated and that pt was referred by PCP office to the [facility] DSS navigators to see if she could qualify for Medicaid. Chart review also indicates pt has ongoing BH counseling support, including a future appt on 11/23/23. No additional health equity team support indicated at this time.

## 2023-11-23 ENCOUNTER — Telehealth (HOSPITAL_COMMUNITY): Payer: No Payment, Other | Admitting: Student

## 2023-11-23 ENCOUNTER — Other Ambulatory Visit: Payer: Self-pay

## 2023-11-23 ENCOUNTER — Encounter (HOSPITAL_COMMUNITY): Payer: Self-pay | Admitting: Student

## 2023-11-23 ENCOUNTER — Encounter (HOSPITAL_COMMUNITY): Payer: No Payment, Other | Admitting: Student

## 2023-11-23 DIAGNOSIS — F411 Generalized anxiety disorder: Secondary | ICD-10-CM | POA: Diagnosis not present

## 2023-11-23 DIAGNOSIS — F3178 Bipolar disorder, in full remission, most recent episode mixed: Secondary | ICD-10-CM

## 2023-11-23 MED ORDER — LURASIDONE HCL 60 MG PO TABS
60.0000 mg | ORAL_TABLET | Freq: Every day | ORAL | 0 refills | Status: DC
  Filled 2023-11-23: qty 30, 30d supply, fill #0
  Filled 2023-12-20: qty 30, 30d supply, fill #1
  Filled 2024-01-18 (×2): qty 30, 30d supply, fill #2
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
## 2023-11-24 ENCOUNTER — Other Ambulatory Visit: Payer: Self-pay

## 2023-12-06 ENCOUNTER — Other Ambulatory Visit (HOSPITAL_COMMUNITY): Payer: No Payment, Other

## 2023-12-20 ENCOUNTER — Other Ambulatory Visit: Payer: Self-pay

## 2023-12-20 ENCOUNTER — Other Ambulatory Visit: Payer: Self-pay | Admitting: Student

## 2023-12-20 MED ORDER — METHIMAZOLE 5 MG PO TABS
5.0000 mg | ORAL_TABLET | Freq: Every day | ORAL | 0 refills | Status: DC
Start: 2023-12-20 — End: 2024-01-21
  Filled 2023-12-20: qty 30, 30d supply, fill #0

## 2023-12-24 ENCOUNTER — Other Ambulatory Visit: Payer: Self-pay

## 2023-12-27 ENCOUNTER — Other Ambulatory Visit (INDEPENDENT_AMBULATORY_CARE_PROVIDER_SITE_OTHER): Payer: No Payment, Other

## 2023-12-27 DIAGNOSIS — Z79899 Other long term (current) drug therapy: Secondary | ICD-10-CM

## 2023-12-27 DIAGNOSIS — F411 Generalized anxiety disorder: Secondary | ICD-10-CM

## 2023-12-27 DIAGNOSIS — F3178 Bipolar disorder, in full remission, most recent episode mixed: Secondary | ICD-10-CM | POA: Diagnosis not present

## 2023-12-27 NOTE — Progress Notes (Signed)
 Pt tolerated venipuncture in right arm with no complaints.   JNL, CMA

## 2023-12-30 LAB — B12 AND FOLATE PANEL: Vitamin B-12: 609 pg/mL (ref 232–1245)

## 2023-12-30 LAB — CBC WITH DIFFERENTIAL/PLATELET

## 2023-12-30 LAB — VITAMIN D 25 HYDROXY (VIT D DEFICIENCY, FRACTURES): Vit D, 25-Hydroxy: 26.8 ng/mL — ABNORMAL LOW (ref 30.0–100.0)

## 2023-12-30 LAB — T4 AND TSH
T4, Total: 9.7 ug/dL (ref 4.5–12.0)
TSH: <0.005 u[IU]/mL — ABNORMAL LOW (ref 0.450–4.500)

## 2023-12-30 LAB — LIPID PANEL
Chol/HDL Ratio: 3.9 ratio (ref 0.0–4.4)
Cholesterol, Total: 217 mg/dL — ABNORMAL HIGH (ref 100–199)
HDL: 56 mg/dL (ref >39)
LDL Chol Calc (NIH): 150 mg/dL — ABNORMAL HIGH (ref 0–99)
Triglycerides: 65 mg/dL (ref 0–149)
VLDL Cholesterol Cal: 11 mg/dL (ref 5–40)

## 2023-12-30 LAB — HEMOGLOBIN A1C

## 2024-01-18 ENCOUNTER — Other Ambulatory Visit: Payer: Self-pay

## 2024-01-19 ENCOUNTER — Other Ambulatory Visit: Payer: Self-pay

## 2024-01-20 ENCOUNTER — Other Ambulatory Visit: Payer: Self-pay

## 2024-01-21 ENCOUNTER — Other Ambulatory Visit: Payer: Self-pay

## 2024-01-21 ENCOUNTER — Encounter (HOSPITAL_COMMUNITY): Payer: Self-pay | Admitting: Student

## 2024-01-21 ENCOUNTER — Telehealth (HOSPITAL_COMMUNITY): Payer: No Payment, Other | Admitting: Student

## 2024-01-21 DIAGNOSIS — E0590 Thyrotoxicosis, unspecified without thyrotoxic crisis or storm: Secondary | ICD-10-CM

## 2024-01-21 DIAGNOSIS — F411 Generalized anxiety disorder: Secondary | ICD-10-CM

## 2024-01-21 DIAGNOSIS — Z758 Other problems related to medical facilities and other health care: Secondary | ICD-10-CM

## 2024-01-21 DIAGNOSIS — E039 Hypothyroidism, unspecified: Secondary | ICD-10-CM

## 2024-01-21 DIAGNOSIS — F3178 Bipolar disorder, in full remission, most recent episode mixed: Secondary | ICD-10-CM | POA: Diagnosis not present

## 2024-01-21 DIAGNOSIS — R7989 Other specified abnormal findings of blood chemistry: Secondary | ICD-10-CM

## 2024-01-21 DIAGNOSIS — E041 Nontoxic single thyroid nodule: Secondary | ICD-10-CM | POA: Diagnosis not present

## 2024-01-21 HISTORY — DX: Hypothyroidism, unspecified: E03.9

## 2024-01-21 HISTORY — DX: Thyrotoxicosis, unspecified without thyrotoxic crisis or storm: E05.90

## 2024-01-21 MED ORDER — METHIMAZOLE 5 MG PO TABS
5.0000 mg | ORAL_TABLET | Freq: Every day | ORAL | 0 refills | Status: DC
  Filled 2024-01-21 – 2024-02-22 (×2): qty 30, 30d supply, fill #0

## 2024-01-21 MED ORDER — LURASIDONE HCL 60 MG PO TABS
60.0000 mg | ORAL_TABLET | Freq: Every day | ORAL | 0 refills | Status: DC
  Filled 2024-01-21: qty 90, 90d supply, fill #0
  Filled 2024-02-22: qty 30, 30d supply, fill #0
  Filled 2024-03-19: qty 30, 30d supply, fill #1

## 2024-01-21 NOTE — Addendum Note (Signed)
 Addended byPrincess Bruins on: 01/21/2024 10:33 AM   Modules accepted: Level of Service
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
## 2024-01-21 NOTE — Addendum Note (Signed)
 Addended byPrincess Bruins on: 01/21/2024 10:16 AM   Modules accepted: Level of Service

## 2024-02-02 ENCOUNTER — Other Ambulatory Visit: Payer: Self-pay

## 2024-02-22 ENCOUNTER — Other Ambulatory Visit: Payer: Self-pay

## 2024-02-23 ENCOUNTER — Other Ambulatory Visit: Payer: Self-pay

## 2024-03-06 ENCOUNTER — Other Ambulatory Visit (HOSPITAL_COMMUNITY): Payer: Self-pay | Admitting: Psychiatry

## 2024-03-06 ENCOUNTER — Encounter (HOSPITAL_COMMUNITY): Payer: Self-pay

## 2024-03-06 ENCOUNTER — Other Ambulatory Visit (HOSPITAL_COMMUNITY): Payer: Self-pay | Admitting: Student

## 2024-03-06 ENCOUNTER — Other Ambulatory Visit (INDEPENDENT_AMBULATORY_CARE_PROVIDER_SITE_OTHER)

## 2024-03-06 ENCOUNTER — Other Ambulatory Visit (HOSPITAL_COMMUNITY)

## 2024-03-06 DIAGNOSIS — F3178 Bipolar disorder, in full remission, most recent episode mixed: Secondary | ICD-10-CM

## 2024-03-06 DIAGNOSIS — F411 Generalized anxiety disorder: Secondary | ICD-10-CM

## 2024-03-06 DIAGNOSIS — E041 Nontoxic single thyroid nodule: Secondary | ICD-10-CM

## 2024-03-06 NOTE — Progress Notes (Signed)
 Patient in today for ordered labs and presented with flat affect, level and pleasant mood.  Patient brought in a letter from [facility] for a bill from 12/27/23 for $695.00.  Patient with no insurance and should have been billed under our indigent agreement.  Wrote this on patient's form from [facility] as she will mail back to them with our account number.  Agreed to also send an email regarding this outstanding bill for patient.  Attempted on patients right arm and hand before completing lab draw for all orders from patient's right brachial area.  Patient tolerated lab attempts and lab draw with no complaints of pain or discomfort as also used freeze spray for assistance.  Patient's labs today to be filed under our indigent agreement and patient reported  no other concerns at this time. Patient stable upon leaving and labs to be sent out to [facility] for resulting.

## 2024-03-07 LAB — CBC WITH DIFFERENTIAL/PLATELET
Basophils Absolute: 0 10*3/uL (ref 0.0–0.2)
Basos: 1 %
EOS (ABSOLUTE): 0.4 10*3/uL (ref 0.0–0.4)
Eos: 7 %
Hematocrit: 38.6 % (ref 34.0–46.6)
Hemoglobin: 12.1 g/dL (ref 11.1–15.9)
Immature Grans (Abs): 0 10*3/uL (ref 0.0–0.1)
Immature Granulocytes: 0 %
Lymphocytes Absolute: 2.4 10*3/uL (ref 0.7–3.1)
Lymphs: 50 %
MCH: 26.6 pg (ref 26.6–33.0)
MCHC: 31.3 g/dL — ABNORMAL LOW (ref 31.5–35.7)
MCV: 85 fL (ref 79–97)
Monocytes Absolute: 0.4 10*3/uL (ref 0.1–0.9)
Monocytes: 9 %
Neutrophils Absolute: 1.6 10*3/uL (ref 1.4–7.0)
Neutrophils: 33 %
Platelets: 245 10*3/uL (ref 150–450)
RBC: 4.55 x10E6/uL (ref 3.77–5.28)
RDW: 13.2 % (ref 11.7–15.4)
WBC: 4.8 10*3/uL (ref 3.4–10.8)

## 2024-03-07 LAB — LIPID PANEL
Chol/HDL Ratio: 3.5 ratio (ref 0.0–4.4)
Cholesterol, Total: 227 mg/dL — ABNORMAL HIGH (ref 100–199)
HDL: 65 mg/dL (ref >39)
LDL Chol Calc (NIH): 152 mg/dL — ABNORMAL HIGH (ref 0–99)
Triglycerides: 56 mg/dL (ref 0–149)
VLDL Cholesterol Cal: 10 mg/dL (ref 5–40)

## 2024-03-07 LAB — B12 AND FOLATE PANEL
Folate: >20 ng/mL (ref >3.0)
Vitamin B-12: 986 pg/mL (ref 232–1245)

## 2024-03-07 LAB — T4 AND TSH
T4, Total: 5.4 ug/dL (ref 4.5–12.0)
TSH: <0.005 u[IU]/mL — ABNORMAL LOW (ref 0.450–4.500)

## 2024-03-07 LAB — HEMOGLOBIN A1C
Est. average glucose Bld gHb Est-mCnc: 114 mg/dL
Hgb A1c MFr Bld: 5.6 % (ref 4.8–5.6)

## 2024-03-07 LAB — VITAMIN D 25 HYDROXY (VIT D DEFICIENCY, FRACTURES): Vit D, 25-Hydroxy: 51.9 ng/mL (ref 30.0–100.0)

## 2024-03-08 LAB — SPECIMEN STATUS REPORT

## 2024-03-08 LAB — COMPREHENSIVE METABOLIC PANEL WITH GFR
ALT: 21 IU/L (ref 0–32)
AST: 25 IU/L (ref 0–40)
Albumin: 4.2 g/dL (ref 3.8–4.9)
Alkaline Phosphatase: 102 IU/L (ref 44–121)
BUN/Creatinine Ratio: 14 (ref 9–23)
BUN: 13 mg/dL (ref 6–24)
Bilirubin Total: <0.2 mg/dL (ref 0.0–1.2)
CO2: 16 mmol/L — ABNORMAL LOW (ref 20–29)
Calcium: 9.9 mg/dL (ref 8.7–10.2)
Chloride: 104 mmol/L (ref 96–106)
Creatinine, Ser: 0.95 mg/dL (ref 0.57–1.00)
Globulin, Total: 3.5 g/dL (ref 1.5–4.5)
Glucose: 87 mg/dL (ref 70–99)
Potassium: 4.8 mmol/L (ref 3.5–5.2)
Sodium: 142 mmol/L (ref 134–144)
Total Protein: 7.7 g/dL (ref 6.0–8.5)
eGFR: 69 mL/min/{1.73_m2} (ref >59)

## 2024-03-09 ENCOUNTER — Encounter (HOSPITAL_COMMUNITY): Payer: Self-pay | Admitting: Student

## 2024-03-10 ENCOUNTER — Telehealth: Payer: Self-pay | Admitting: *Deleted

## 2024-03-10 NOTE — Telephone Encounter (Signed)
 Copied from CRM [id number]. Topic: Clinical - Lab/Test Results >> Mar 10, 2024  9:08 AM Tracy Keith F wrote: Reason for CRM:   Patient called in after receiving her lab results stating that her cholesterol was high and that her thyroid  levels had not improved; Patient has concerns that her methimazole  (TAPAZOLE ) 5 MG tablet prescription is not working and looking for advice to improve her current state. Please review patient's recent lab work.  Callback Number: [id number]

## 2024-03-13 NOTE — Telephone Encounter (Signed)
 Called pt to schedule an appt - call transferred to the front office. Appt scheduled w/Dr Lydia Sams  03/15/24.

## 2024-03-15 ENCOUNTER — Other Ambulatory Visit: Payer: Self-pay

## 2024-03-15 ENCOUNTER — Encounter: Payer: Self-pay | Admitting: Student

## 2024-03-15 ENCOUNTER — Ambulatory Visit (INDEPENDENT_AMBULATORY_CARE_PROVIDER_SITE_OTHER): Payer: Self-pay | Admitting: Student

## 2024-03-15 VITALS — BP 124/88 | HR 59 | Temp 98.3°F | Ht 65.0 in | Wt 179.0 lb

## 2024-03-15 DIAGNOSIS — R7989 Other specified abnormal findings of blood chemistry: Secondary | ICD-10-CM

## 2024-03-15 DIAGNOSIS — F319 Bipolar disorder, unspecified: Secondary | ICD-10-CM

## 2024-03-15 DIAGNOSIS — F411 Generalized anxiety disorder: Secondary | ICD-10-CM

## 2024-03-15 DIAGNOSIS — F3178 Bipolar disorder, in full remission, most recent episode mixed: Secondary | ICD-10-CM

## 2024-03-15 DIAGNOSIS — E0590 Thyrotoxicosis, unspecified without thyrotoxic crisis or storm: Secondary | ICD-10-CM

## 2024-03-15 DIAGNOSIS — Z758 Other problems related to medical facilities and other health care: Secondary | ICD-10-CM

## 2024-03-15 DIAGNOSIS — E785 Hyperlipidemia, unspecified: Secondary | ICD-10-CM | POA: Insufficient documentation

## 2024-03-15 DIAGNOSIS — E041 Nontoxic single thyroid nodule: Secondary | ICD-10-CM

## 2024-03-15 DIAGNOSIS — E7849 Other hyperlipidemia: Secondary | ICD-10-CM

## 2024-03-15 MED ORDER — METHIMAZOLE 5 MG PO TABS
5.0000 mg | ORAL_TABLET | Freq: Every day | ORAL | 3 refills | Status: DC
  Filled 2024-03-15: qty 30, 30d supply, fill #0
  Filled 2024-04-25: qty 30, 30d supply, fill #1
  Filled 2024-05-26: qty 30, 30d supply, fill #2
  Filled 2024-07-16: qty 30, 30d supply, fill #3

## 2024-03-15 NOTE — Assessment & Plan Note (Signed)
 Patient has a past medical history of hyperthyroidism.  Patient has been worked up for this multiple times.  She has been seen by her clinic multiple times however she has not been consistently taking her methimazole .  She has finally started to consistently take her methimazole , and labs seem to be drawn a little too early which showed very decreased TSH.  Fortunately patient not showing any signs of thyroid  storm.  Psychiatry did draw total T4, would like to get a free T4.  However patient does not want to get labs at this time as she does not have insurance.  She states that her Medicaid will be approved next month and at that time she would like to get labs.  I would also want patient to get thyroid  receptor antibodies to workup Graves' disease.  She however declines that as well as she wants to wait for her insurance to be approved.  I discussed the importance of treating her thyroid  and she understood this.  She has no questions about her thyroid  disease.  Plan: - Continue methimazole  5 mg daily - Follow-up in 4 weeks and get TSH-Free T4, and thyroid  hormone receptor antibodies at that time - If she sees psychiatry before and they would like to get labs, recommend getting TSH, free T4 and total T3 as well as thyroid  hormone receptor antibodies

## 2024-03-15 NOTE — Progress Notes (Signed)
 CC: Hyperthyroidism follow-up  HPI:  [name] is a 58 y.o. female with past medical history of hypothyroidism, history of a thyroid  nodule, bipolar disorder who presents for appointment discussing thyroid  disease.  Please see assessment and plan for full HPI.  Medications: Bipolar disorder: Latuda  60 mg nightly Hyperthyroidism methimazole  5 mg daily   Past Medical History:  Diagnosis Date   GAD (generalized anxiety disorder) 05/04/2023   Hyperthyroidism 01/21/2024   Hypothyroidism 01/21/2024     Current Outpatient Medications:    Lurasidone  HCl (LATUDA ) 60 MG TABS, Take 1 tablet (60 mg total) by mouth at bedtime., Disp: 90 tablet, Rfl: 0   methimazole  (TAPAZOLE ) 5 MG tablet, Take 1 tablet (5 mg total) by mouth daily., Disp: 30 tablet, Rfl: 3  Review of Systems:   Constitutional: Patient denies any headaches or vision changes or night sweats Cardiovascular: Patient denies any chest pains or palpitations GI: Patient denies any diarrhea  Physical Exam:  Vitals:   03/15/24 0924  BP: 124/88  Pulse: (!) 59  Temp: 98.3 F (36.8 C)  TempSrc: Oral  SpO2: 98%  Weight: 179 lb (81.2 kg)  Height: 5\' 5"  (1.651 m)   General: Patient is sitting comfortably in the room  Head: Normocephalic, atraumatic  Neck: No obvious goiter noted on thyroid .  Nodules are not palpable. Cardio: Regular rate and rhythm, no murmurs, rubs or gallops Pulmonary: Clear to ausculation bilaterally with no rales, rhonchi, and crackles    Assessment & Plan:   Hyperthyroidism Patient has a past medical history of hyperthyroidism.  Patient has been worked up for this multiple times.  She has been seen by her clinic multiple times however she has not been consistently taking her methimazole .  She has finally started to consistently take her methimazole , and labs seem to be drawn a little too early which showed very decreased TSH.  Fortunately patient not showing any signs of thyroid  storm.   Psychiatry did draw total T4, would like to get a free T4.  However patient does not want to get labs at this time as she does not have insurance.  She states that her Medicaid will be approved next month and at that time she would like to get labs.  I would also want patient to get thyroid  receptor antibodies to workup Graves' disease.  She however declines that as well as she wants to wait for her insurance to be approved.  I discussed the importance of treating her thyroid  and she understood this.  She has no questions about her thyroid  disease.  Plan: - Continue methimazole  5 mg daily - Follow-up in 4 weeks and get TSH-Free T4, and thyroid  hormone receptor antibodies at that time - If she sees psychiatry before and they would like to get labs, recommend getting TSH, free T4 and total T3 as well as thyroid  hormone receptor antibodies  Thyroid  nodule Patient has had thyroid  nodules, of which 1 did look suspicious and biopsy was recommended.  However patient has been lost to follow-up and referral was placed for IR.  Would want to get patient's thyroid  biopsied and I replaced the order for IR.  Plan: - Referred patient to IR for thyroid  nodule biopsy  Does not have primary care provider Patient does have a primary care doctor.  I am her primary care doctor.  This problem has been addressed.  Hyperlipidemia Patient has a past medical history of hyperlipidemia.  Most recent labs in May 2025 show an elevated LDL at 152.  Her ASCVD risk score is 2.5%.  Will recommend lifestyle changes at this time.  If patient does not improve, will likely need a statin moving forward.  Plan: - Lifestyle modifications  Patient discussed with Dr. Sonia Durand, DO PGY-2 Internal Medicine Resident

## 2024-03-15 NOTE — Assessment & Plan Note (Signed)
 Patient has had thyroid  nodules, of which 1 did look suspicious and biopsy was recommended.  However patient has been lost to follow-up and referral was placed for IR.  Would want to get patient's thyroid  biopsied and I replaced the order for IR.  Plan: - Referred patient to IR for thyroid  nodule biopsy

## 2024-03-15 NOTE — Assessment & Plan Note (Signed)
 Patient has a past medical history of hyperlipidemia.  Most recent labs in May 2025 show an elevated LDL at 152.  Her ASCVD risk score is 2.5%.  Will recommend lifestyle changes at this time.  If patient does not improve, will likely need a statin moving forward.  Plan: - Lifestyle modifications

## 2024-03-15 NOTE — Patient Instructions (Addendum)
 Tracy Keith,Thank you for allowing me to take part in your care today.  Here are your instructions.  1. Regarding your thyroid  labs, I would like for you to come back to get them when you medicaid gets approved.   2. Please continue your methimazole  to 5 mg daily. I will send it to your pharmacy.  3. Please come back in one month and we can discuss your thyroid  at that time.   4. I will refer you to a specialist to see if we can get the biopsy of your nodule   For psychiatry: If they order your labs please get TSH, and Free T4 as well as total T3. I would also like for them to get Thyroid  receptor antibodies   Thank you, Dr. Lydia Sams  If you have any other questions please contact the internal medicine clinic at [phone] If it is after hours, please call the [facility] at [phone] and then ask the person who picks up for the resident on call.

## 2024-03-15 NOTE — Assessment & Plan Note (Signed)
 Patient does have a primary care doctor.  I am her primary care doctor.  This problem has been addressed.

## 2024-03-16 ENCOUNTER — Other Ambulatory Visit: Payer: Self-pay | Admitting: Internal Medicine

## 2024-03-16 DIAGNOSIS — E041 Nontoxic single thyroid nodule: Secondary | ICD-10-CM

## 2024-03-16 NOTE — Progress Notes (Signed)
 Internal Medicine Clinic Attending  Case discussed with the resident at the time of the visit.  We reviewed the resident's history and exam and pertinent patient test results.  I agree with the assessment, diagnosis, and plan of care documented in the resident's note.

## 2024-03-21 ENCOUNTER — Other Ambulatory Visit: Payer: Self-pay

## 2024-03-23 ENCOUNTER — Other Ambulatory Visit: Payer: Self-pay

## 2024-03-23 ENCOUNTER — Telehealth (HOSPITAL_COMMUNITY): Admitting: Student

## 2024-03-23 ENCOUNTER — Encounter (HOSPITAL_COMMUNITY): Payer: Self-pay | Admitting: Student

## 2024-03-23 DIAGNOSIS — E0590 Thyrotoxicosis, unspecified without thyrotoxic crisis or storm: Secondary | ICD-10-CM | POA: Diagnosis not present

## 2024-03-23 DIAGNOSIS — F411 Generalized anxiety disorder: Secondary | ICD-10-CM

## 2024-03-23 DIAGNOSIS — R7989 Other specified abnormal findings of blood chemistry: Secondary | ICD-10-CM

## 2024-03-23 DIAGNOSIS — E041 Nontoxic single thyroid nodule: Secondary | ICD-10-CM

## 2024-03-23 DIAGNOSIS — Z8639 Personal history of other endocrine, nutritional and metabolic disease: Secondary | ICD-10-CM | POA: Insufficient documentation

## 2024-03-23 DIAGNOSIS — F3178 Bipolar disorder, in full remission, most recent episode mixed: Secondary | ICD-10-CM | POA: Diagnosis not present

## 2024-03-23 MED ORDER — LURASIDONE HCL 60 MG PO TABS
60.0000 mg | ORAL_TABLET | Freq: Every day | ORAL | 0 refills | Status: DC
Start: 2024-03-23 — End: 2024-07-02
  Filled 2024-03-24: qty 30, 30d supply, fill #0
  Filled 2024-04-25: qty 30, 30d supply, fill #1
  Filled 2024-05-26: qty 30, 30d supply, fill #2
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
## 2024-03-24 ENCOUNTER — Other Ambulatory Visit: Payer: Self-pay

## 2024-03-27 ENCOUNTER — Other Ambulatory Visit: Payer: Self-pay

## 2024-04-10 ENCOUNTER — Telehealth: Payer: Self-pay | Admitting: *Deleted

## 2024-04-10 NOTE — Telephone Encounter (Signed)
 Copied from CRM [id number]. Topic: Referral - Question >> Apr 07, 2024 12:21 PM Shelby Dessert H wrote: Reason for CRM: Patient called and stated that [facility] only does [facility] in [place] and she is needing a place closer, please assist patients callback number is [phone].

## 2024-04-12 ENCOUNTER — Encounter: Payer: Self-pay | Admitting: Student

## 2024-04-12 ENCOUNTER — Ambulatory Visit: Payer: MEDICAID | Admitting: Student

## 2024-04-12 VITALS — BP 121/82 | HR 60 | Temp 98.4°F | Ht 65.0 in | Wt 181.2 lb

## 2024-04-12 DIAGNOSIS — E0590 Thyrotoxicosis, unspecified without thyrotoxic crisis or storm: Secondary | ICD-10-CM | POA: Diagnosis not present

## 2024-04-12 DIAGNOSIS — Z6830 Body mass index (BMI) 30.0-30.9, adult: Secondary | ICD-10-CM | POA: Diagnosis not present

## 2024-04-12 DIAGNOSIS — E041 Nontoxic single thyroid nodule: Secondary | ICD-10-CM

## 2024-04-12 DIAGNOSIS — E669 Obesity, unspecified: Secondary | ICD-10-CM | POA: Diagnosis not present

## 2024-04-12 DIAGNOSIS — Z23 Encounter for immunization: Secondary | ICD-10-CM

## 2024-04-12 DIAGNOSIS — Z1211 Encounter for screening for malignant neoplasm of colon: Secondary | ICD-10-CM | POA: Insufficient documentation

## 2024-04-12 NOTE — Assessment & Plan Note (Signed)
 There has been some trouble in getting her thyroid  biopsy.  She has been given a new number to call to schedule her biopsy.  Plan: - Patient instructed to call IR for biopsy, referral is in

## 2024-04-12 NOTE — Progress Notes (Signed)
   CC: Thyroid  follow up   HPI:  [name] is a 58 y.o. female with past medical history of hyperthyroidism, history of a thyroid  nodule, bipolar disorder who presents for appointment discussing thyroid  disease.  Please see assessment and plan for full HPI.   Medications: Bipolar disorder: Latuda  60 mg nightly Hyperthyroidism methimazole  5 mg daily  Past Medical History:  Diagnosis Date   GAD (generalized anxiety disorder) 05/04/2023   H/O vitamin D  deficiency 03/23/2024   Hyperthyroidism 01/21/2024   Hypothyroidism 01/21/2024     Current Outpatient Medications:    Lurasidone  HCl (LATUDA ) 60 MG TABS, Take 1 tablet (60 mg total) by mouth at bedtime., Disp: 90 tablet, Rfl: 0   methimazole  (TAPAZOLE ) 5 MG tablet, Take 1 tablet (5 mg total) by mouth daily., Disp: 30 tablet, Rfl: 3  Review of Systems:    Negative except for what is stated in HPI  Physical Exam:  Vitals:   04/12/24 0918  BP: 121/82  Pulse: 60  Temp: 98.4 F (36.9 C)  TempSrc: Oral  SpO2: 96%  Weight: 181 lb 3.2 oz (82.2 kg)  Height: 5' 5 (1.651 m)   General: Patient is sitting comfortably in the room  Head: Normocephalic, atraumatic  Cardio: Regular rate and rhythm, no murmurs, rubs or gallops Pulmonary: Clear to ausculation bilaterally with no rales, rhonchi, and crackles    Assessment & Plan:   Hyperthyroidism Patient presents for hyperthyroidism follow-up.  She has been consistently taking her methimazole  5 mg daily.  She has finally obtained her Medicaid.  She can now get labs.  Will check her free T4 and TSH today.  Will titrate methimazole  according to results.  Plan: - Continue methimazole  5 mg daily - Follow-up TSH and free T4  Thyroid  nodule There has been some trouble in getting her thyroid  biopsy.  She has been given a new number to call to schedule her biopsy.  Plan: - Patient instructed to call IR for biopsy, referral is in  Obesity (BMI 30-39.9) Patient has past medical  history of obesity.  BMI is 30.15.  She has gained weight over the past year.  About 10 pounds.  She states she would like to lose weight.  Did talk to her about potentially starting GLP-1, but given her workup for thyroid  nodule, we will refrain from this at this time.  Will follow-up results of thyroid  biopsy to see if patient has cancer or not.  Medullary thyroid  cancer would be a contraindication to GLP-1.  Recommend lifestyle changes, but patient states she is trying these.  I did tell her that Latuda  could be contributing to weight gain but she should not stop taking this medicine.  Plan: - Lifestyle modifications - Follow-up thyroid  biopsy, if negative for malignancy, could potentially start GLP-1  Colon cancer screening Referred for colon cancer screening.  Plan:  -Referral placed for colonoscopy   Encounter for immunization Updated Tdap vaccine today.  Patient discussed with Dr. Bevelyn Bryant  Jonelle Neri, DO PGY-2 Internal Medicine Resident

## 2024-04-12 NOTE — Patient Instructions (Addendum)
 Tracy Keith,Thank you for allowing me to take part in your care today.  Here are your instructions.  1. Please all the IR doctor to make sure that you can get appointment to make sure that you get the biopsy   2. I am going to check labs, please await my phone call or my message to make sure that you get the results.  3. I have referred you for a colonoscopy  4. We are going to get your Tetanus vaccine today    PLEASE BRING YOUR MEDICATIONS TO EVERY APPOINTMENT  Thank you, Dr. Lydia Sams  If you have any other questions please contact the internal medicine clinic at [phone] If it is after hours, please call the [facility] at [phone] and then ask the person who picks up for the resident on call.

## 2024-04-12 NOTE — Assessment & Plan Note (Signed)
 Referred for colon cancer screening.  Plan:  -Referral placed for colonoscopy

## 2024-04-12 NOTE — Assessment & Plan Note (Signed)
 Patient presents for hyperthyroidism follow-up.  She has been consistently taking her methimazole  5 mg daily.  She has finally obtained her Medicaid.  She can now get labs.  Will check her free T4 and TSH today.  Will titrate methimazole  according to results.  Plan: - Continue methimazole  5 mg daily - Follow-up TSH and free T4

## 2024-04-12 NOTE — Assessment & Plan Note (Signed)
 Patient has past medical history of obesity.  BMI is 30.15.  She has gained weight over the past year.  About 10 pounds.  She states she would like to lose weight.  Did talk to her about potentially starting GLP-1, but given her workup for thyroid  nodule, we will refrain from this at this time.  Will follow-up results of thyroid  biopsy to see if patient has cancer or not.  Medullary thyroid  cancer would be a contraindication to GLP-1.  Recommend lifestyle changes, but patient states she is trying these.  I did tell her that Latuda  could be contributing to weight gain but she should not stop taking this medicine.  Plan: - Lifestyle modifications - Follow-up thyroid  biopsy, if negative for malignancy, could potentially start GLP-1

## 2024-04-12 NOTE — Assessment & Plan Note (Signed)
 Updated Tdap vaccine today.

## 2024-04-13 ENCOUNTER — Ambulatory Visit: Payer: Self-pay | Admitting: Student

## 2024-04-13 DIAGNOSIS — E0590 Thyrotoxicosis, unspecified without thyrotoxic crisis or storm: Secondary | ICD-10-CM

## 2024-04-13 LAB — TSH: TSH: 0.012 u[IU]/mL — ABNORMAL LOW (ref 0.450–4.500)

## 2024-04-13 LAB — T4, FREE: Free T4: 0.71 ng/dL — ABNORMAL LOW (ref 0.82–1.77)

## 2024-04-13 NOTE — Telephone Encounter (Signed)
 Future orders placed for thyroid  test.  Please see lab results comment.  Attempted to call patient, no answer.  Sent MyChart message.

## 2024-04-17 NOTE — Progress Notes (Signed)
 Internal Medicine Clinic Attending  Case discussed with the resident at the time of the visit.  We reviewed the resident's history and exam and pertinent patient test results.  I agree with the assessment, diagnosis, and plan of care documented in the resident's note.

## 2024-04-19 ENCOUNTER — Ambulatory Visit
Admission: RE | Admit: 2024-04-19 | Discharge: 2024-04-19 | Disposition: A | Discharge status: Home / Self Care | Payer: MEDICAID | Source: Ambulatory Visit | Attending: Internal Medicine

## 2024-04-19 ENCOUNTER — Other Ambulatory Visit (HOSPITAL_COMMUNITY)
Admission: RE | Admit: 2024-04-19 | Discharge: 2024-04-19 | Disposition: A | Discharge status: Home / Self Care | Payer: MEDICAID | Source: Ambulatory Visit | Attending: Student | Admitting: Student

## 2024-04-19 DIAGNOSIS — E041 Nontoxic single thyroid nodule: Secondary | ICD-10-CM | POA: Diagnosis present

## 2024-04-24 LAB — CYTOLOGY - NON PAP

## 2024-04-25 ENCOUNTER — Other Ambulatory Visit: Payer: Self-pay

## 2024-04-25 ENCOUNTER — Telehealth: Payer: Self-pay | Admitting: *Deleted

## 2024-04-25 MED ORDER — WEGOVY 0.25 MG/0.5ML ~~LOC~~ SOAJ
0.2500 mg | SUBCUTANEOUS | 3 refills | Status: DC
  Filled 2024-04-25 – 2024-05-01 (×3): qty 2, 28d supply, fill #0
  Filled 2024-05-26: qty 2, 28d supply, fill #1

## 2024-04-25 MED ORDER — PEN NEEDLES 32G X 4 MM MISC
1.0000 | 1 refills | Status: AC
  Filled 2024-04-25: qty 100, 100d supply, fill #0
  Filled 2024-05-25: qty 100, fill #0
  Filled 2024-05-26 – 2024-07-16 (×2): qty 100, 100d supply, fill #0
  Filled 2024-09-01: qty 100, 90d supply, fill #0

## 2024-04-25 NOTE — Telephone Encounter (Signed)
 Source  Tracy Keith, Tracy Keith (Patient)   Subject  Tracy Keith (Patient)   Topic  Clinical - Lab/Test Results     Communication  Reason for CRM: Pt wanting to speak with the doctor about the thyroid  biopsy results and the wegovy medication. Please contact pt.

## 2024-04-27 ENCOUNTER — Other Ambulatory Visit: Payer: Self-pay

## 2024-05-01 ENCOUNTER — Other Ambulatory Visit: Payer: Self-pay

## 2024-05-01 ENCOUNTER — Telehealth: Payer: Self-pay

## 2024-05-01 NOTE — Telephone Encounter (Signed)
 Pharmacy Patient Advocate Encounter   Received notification from CoverMyMeds that prior authorization for WEGOVY  0.25MG  is required/requested.   Insurance verification completed.   The patient is insured through [facility] .   PA required; PA submitted to above mentioned insurance via CoverMyMeds Key/confirmation #/EOC [id number]. Status is pending

## 2024-05-01 NOTE — Telephone Encounter (Signed)
 Pharmacy Patient Advocate Encounter  Received notification from [employer] that Prior Authorization for WEGOVY  0.25MG  has been APPROVED from 05/01/24 to 11/01/24

## 2024-05-03 ENCOUNTER — Other Ambulatory Visit (INDEPENDENT_AMBULATORY_CARE_PROVIDER_SITE_OTHER): Payer: MEDICAID

## 2024-05-03 ENCOUNTER — Other Ambulatory Visit: Payer: Self-pay

## 2024-05-03 DIAGNOSIS — E0590 Thyrotoxicosis, unspecified without thyrotoxic crisis or storm: Secondary | ICD-10-CM | POA: Diagnosis not present

## 2024-05-04 LAB — T3: T3, Total: 79 ng/dL (ref 71–180)

## 2024-05-04 LAB — TSH: TSH: 0.167 u[IU]/mL — ABNORMAL LOW (ref 0.450–4.500)

## 2024-05-04 LAB — T4, FREE: Free T4: 0.6 ng/dL — ABNORMAL LOW (ref 0.82–1.77)

## 2024-05-10 ENCOUNTER — Ambulatory Visit: Payer: Self-pay | Admitting: Student

## 2024-05-24 ENCOUNTER — Encounter: Payer: Self-pay | Admitting: Gastroenterology

## 2024-05-25 ENCOUNTER — Other Ambulatory Visit: Payer: Self-pay

## 2024-05-26 ENCOUNTER — Other Ambulatory Visit: Payer: Self-pay
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
## 2024-05-29 ENCOUNTER — Telehealth: Payer: Self-pay | Admitting: *Deleted

## 2024-05-29 ENCOUNTER — Other Ambulatory Visit: Payer: Self-pay

## 2024-05-29 NOTE — Telephone Encounter (Unsigned)
 Copied from CRM [id number]. Topic: Clinical - Medication Question >> May 26, 2024  1:32 PM Adrianna P wrote: Reason for CRM: Patient said that her Semaglutide -Weight Management (WEGOVY ) 0.25 MG/0.5ML SOAJ is supposed to increase every month, and hers has not increased. Please call [phone]

## 2024-05-30 ENCOUNTER — Encounter (HOSPITAL_COMMUNITY): Payer: Self-pay | Admitting: Student in an Organized Health Care Education/Training Program

## 2024-05-30 ENCOUNTER — Ambulatory Visit (INDEPENDENT_AMBULATORY_CARE_PROVIDER_SITE_OTHER): Payer: MEDICAID | Admitting: Student in an Organized Health Care Education/Training Program

## 2024-05-30 ENCOUNTER — Other Ambulatory Visit: Payer: Self-pay

## 2024-05-30 DIAGNOSIS — F411 Generalized anxiety disorder: Secondary | ICD-10-CM | POA: Diagnosis not present

## 2024-05-30 DIAGNOSIS — R7989 Other specified abnormal findings of blood chemistry: Secondary | ICD-10-CM | POA: Diagnosis not present

## 2024-05-30 DIAGNOSIS — E041 Nontoxic single thyroid nodule: Secondary | ICD-10-CM | POA: Diagnosis not present

## 2024-05-30 DIAGNOSIS — F3178 Bipolar disorder, in full remission, most recent episode mixed: Secondary | ICD-10-CM | POA: Diagnosis not present

## 2024-05-30 DIAGNOSIS — E0590 Thyrotoxicosis, unspecified without thyrotoxic crisis or storm: Secondary | ICD-10-CM

## 2024-05-30 MED ORDER — LURASIDONE HCL 60 MG PO TABS
60.0000 mg | ORAL_TABLET | Freq: Every day | ORAL | 0 refills | Status: DC
  Filled 2024-07-16: qty 90, 90d supply, fill #0

## 2024-05-31 ENCOUNTER — Other Ambulatory Visit: Payer: Self-pay

## 2024-05-31 NOTE — Telephone Encounter (Signed)
 Call from C2D2 patient needs new prescription for the Wegovy  .  She will be taking it today if possible.

## 2024-06-01 ENCOUNTER — Ambulatory Visit (AMBULATORY_SURGERY_CENTER): Payer: MEDICAID

## 2024-06-01 ENCOUNTER — Other Ambulatory Visit: Payer: Self-pay

## 2024-06-01 VITALS — Ht 65.0 in | Wt 183.0 lb

## 2024-06-01 DIAGNOSIS — Z1211 Encounter for screening for malignant neoplasm of colon: Secondary | ICD-10-CM

## 2024-06-01 MED ORDER — NA SULFATE-K SULFATE-MG SULF 17.5-3.13-1.6 GM/177ML PO SOLN
1.0000 | Freq: Once | ORAL | 0 refills | Status: AC
  Filled 2024-06-01: qty 354, 1d supply, fill #0

## 2024-06-01 NOTE — Progress Notes (Signed)
 No egg or soy allergy known to patient  No issues known to pt with past sedation with any surgeries or procedures Patient denies ever being told they had issues or difficulty with intubation  No FH of Malignant Hyperthermia  Pt is not on diet pills; Is on Wegovy  and hold instructions provided  Pt is not on  home 02  Pt is not on blood thinners   Pt has some issues with constipation; takes a laxative.  Extra Miralax instructed on prep   No A fib or A flutter Have any cardiac testing pending--No Pt can ambulate  Pt denies use of chewing tobacco Discussed diabetic I weight loss medication holds Discussed NSAID holds Checked BMI Pt instructed to use Singlecare.com or GoodRx for a price reduction on prep  Patient's chart reviewed by Norleen Schillings CNRA prior to previsit and patient appropriate for the [facility].  Pre visit completed and red dot placed by patient's name on their procedure day (on provider's schedule).

## 2024-06-02 NOTE — Telephone Encounter (Signed)
 Call from patient needs new prescription sent to the Pharmacy with the increase in her Wegovy  as she is doing ok with the current dose and is ready for the increase.  Needs today .

## 2024-06-05 ENCOUNTER — Other Ambulatory Visit: Payer: Self-pay

## 2024-06-05 MED ORDER — WEGOVY 0.25 MG/0.5ML ~~LOC~~ SOAJ: 0.2500 mg | SUBCUTANEOUS | 3 refills | Status: DC

## 2024-06-05 NOTE — Telephone Encounter (Unsigned)
 Copied from CRM [id number]. Topic: Clinical - Medication Question >> May 26, 2024  1:32 PM Adrianna P wrote: Reason for CRM: Patient said that her Semaglutide -Weight Management (WEGOVY ) 0.25 MG/0.5ML SOAJ is supposed to increase every month, and hers has not increased. Please call [phone] >> Jun 05, 2024  1:42 PM Marda MATSU wrote: Pt Popiel is calling again for the status of her medication. Semaglutide -Weight Management (WEGOVY ) 0.25 MG/0.5ML SOAJ [[id number]  Please advise  >> May 31, 2024  1:54 PM Zane F wrote: Patient is calling back in to receive an update on why her Semaglutide -Weight Management (WEGOVY ) 0.25 MG/0.5ML SOAJ  prescription increase of 0.5 mg has not been sent over to her preferred pharmacy; Patient acknowledged she was tolerating the 0.25 mg prescription well per RN discussion on 05/29/2024 ; PAS contacted [facility] to see if the matter can be worked on further to deliver the increase of the prescription as today is the patient injection day. The RN will resend the previous message and have the PCP write the prescription, her hope is that the prescription will be sent in by end of business day with the help of another prescribing provider.  Preferred Pharmacy:   [facility] [address], [place] [address] Phone: [phone]  Fax: [phone]   Callback Number: [phone]

## 2024-06-06 ENCOUNTER — Encounter: Payer: Self-pay | Admitting: Student

## 2024-06-06 ENCOUNTER — Other Ambulatory Visit: Payer: Self-pay

## 2024-06-06 ENCOUNTER — Ambulatory Visit: Payer: Self-pay

## 2024-06-06 ENCOUNTER — Other Ambulatory Visit: Payer: Self-pay | Admitting: Student

## 2024-06-06 MED ORDER — WEGOVY 0.25 MG/0.5ML ~~LOC~~ SOAJ
0.5000 mg | SUBCUTANEOUS | 3 refills | Status: DC
  Filled 2024-06-06: qty 2, 28d supply, fill #0

## 2024-06-06 NOTE — Telephone Encounter (Signed)
 Pt has an appt tomorrow 8/13 with Dr Napoleon.

## 2024-06-06 NOTE — Telephone Encounter (Signed)
 Appointment made for tomorrow 06/07/2024 at 9 AM with Dr Melvenia Morrison   FYI Only or Action Required?: Action required by provider: medication refill request and Patient states her Pharmacy doesn't have her Wegovy  prescription.  Patient was last seen in primary care on 04/12/2024 by Tobie Gaines, DO.  Called Nurse Triage reporting Right Shoulder Pain.  Symptoms began 2 weeks ago.  Interventions attempted: Nothing.  Symptoms are: gradually worsening.  Triage Disposition: See PCP When Office is Open (Within 3 Days)  Patient/caregiver understands and will follow disposition?: Yes                  Copied from CRM [id number]. Topic: Clinical - Red Word Triage >> Jun 06, 2024  2:06 PM Susanna ORN wrote: Red Word that prompted transfer to Nurse Triage: Patient states her right arm is hurting really bad. States it's sore and it's tight in the arm pit area. Wants to make an appt to see provider. Reason for Disposition  [1] MODERATE pain (e.g., interferes with normal activities) AND [2] present > 3 days  Answer Assessment - Initial Assessment Questions Patient having right shoulder pain/stiffness She states that she thinks her Latuda  could possibly be causing it to be stiff, she isnt sure She denies any injuries, bruising, redness, or any other symptoms Patient also states that she called her Pharmacy and her prescription for Wegovy  was not there yet  She is advised that if anything worsens to go to the Emergency Room Patient verbalized understanding.    1. ONSET: When did the pain start?     2 weeks ago 2. LOCATION: Where is the pain located?     Right shoulder 3. PAIN: How bad is the pain? (Scale 1-10; or mild, moderate, severe)     Hurts to lift it 4. WORK OR EXERCISE: Has there been any recent work or exercise that involved this part of the body?     Daily use 5. CAUSE: What do you think is causing the shoulder pain?     unsure 6. OTHER SYMPTOMS: Do  you have any other symptoms? (e.g., neck pain, swelling, rash, fever, numbness, weakness)     No 7. PREGNANCY: Is there any chance you are pregnant? When was your last menstrual period?     No  Protocols used: Shoulder Pain-A-AH

## 2024-06-06 NOTE — Telephone Encounter (Signed)
 Copied from CRM [id number]. Topic: General - Other >> Jun 06, 2024  4:33 PM Mercer PEDLAR wrote: Reason for CRM: Patient is returning call for [facility], she wanted to let her know that she is tolerating medication well and will be at scheduled appointment tomorrow 06/07/24.

## 2024-06-07 ENCOUNTER — Ambulatory Visit: Payer: MEDICAID

## 2024-06-07 ENCOUNTER — Other Ambulatory Visit: Payer: Self-pay

## 2024-06-07 VITALS — BP 111/77 | HR 65 | Temp 98.0°F | Ht 65.0 in | Wt 183.2 lb

## 2024-06-07 DIAGNOSIS — Z6830 Body mass index (BMI) 30.0-30.9, adult: Secondary | ICD-10-CM | POA: Diagnosis not present

## 2024-06-07 DIAGNOSIS — M7541 Impingement syndrome of right shoulder: Secondary | ICD-10-CM

## 2024-06-07 DIAGNOSIS — E669 Obesity, unspecified: Secondary | ICD-10-CM | POA: Diagnosis not present

## 2024-06-07 DIAGNOSIS — Z1231 Encounter for screening mammogram for malignant neoplasm of breast: Secondary | ICD-10-CM

## 2024-06-07 MED ORDER — WEGOVY 0.5 MG/0.5ML ~~LOC~~ SOAJ
0.5000 mg | SUBCUTANEOUS | 1 refills | Status: DC
  Filled 2024-06-07: qty 2, 28d supply, fill #0

## 2024-06-07 NOTE — Patient Instructions (Signed)
 Thank you, Tracy Keith, for allowing us  to provide your care today. Today we discussed . . .  > Impingement syndrome/Rotator cuff tendinitis       - Your symptoms and physical exam show that your rotator cuff is inflamed. This should continue to get better with time. You can take Ibuprofen  400-600mg  up every 6 hours for 2 weeks. I have also attached a home exercise program. > Weight loss       - I prescribed Wegovy  0.5 mg weekly. > Breast cancer screening       - I placed a referral for mammogram.     Follow up: as scheduled on 9/24    Remember:  Should you have any questions or concerns please call the internal medicine clinic at [phone].     Melvenia Morrison, [facility]

## 2024-06-07 NOTE — Progress Notes (Signed)
 CC: Acute Concern of right shoulder pain  HPI:  Tracy Keith is a 58 y.o. female with pertinent PMH of obesity, hyperthyroidism, and bipolar disorder who presents for acute concern of right shoulder pain. Please see problem based assessment and plan for further history.    Medications: Current Outpatient Medications  Medication Instructions   Insulin  Pen Needle (PEN NEEDLES) 32G X 4 MM MISC Use as directed   Lurasidone  HCl (LATUDA ) 60 mg, Oral, Daily at bedtime   methimazole  (TAPAZOLE ) 5 mg, Oral, Daily   Na Sulfate-K Sulfate-Mg Sulfate concentrate (SUPREP) 17.5-3.13-1.6 GM/177ML SOLN 354 mLs, Oral,  Once   sennosides-docusate sodium  (SENOKOT-S) 8.6-50 MG tablet 1 tablet, Daily   Wegovy  0.5 mg, Subcutaneous, Weekly     Physical Exam:  Vitals:   06/07/24 0906  BP: 111/77  Pulse: 65  Temp: 98 F (36.7 C)  TempSrc: Oral  SpO2: 100%  Weight: 183 lb 3.2 oz (83.1 kg)  Height: 5' 5 (1.651 m)    Physical Exam Musculoskeletal:     Right shoulder: No swelling or deformity.     Left shoulder: Normal. No swelling, deformity or tenderness. Normal range of motion.     Comments: Left shoulder with normal range of motion and no tenderness.  Right shoulder with slightly decreased active range of motion in abduction and forward flexion due to pain.  Passive range of motion normal without pain.  Positive Hawkins.  Pain with resisted external rotation testing.  4+ out of 5 strength on empty can testing, although limited by pain.  5 out of 5 strength with external and internal rotation.  Cross body stretching did not induce pain at the [facility] joint.       Assessment & Plan:   Assessment & Plan Impingement syndrome of right shoulder He has had 2 weeks of shoulder pain which started without inciting incident or injury.  She has not tried anything for her pain.  Her symptoms have been gradually improving over time.  She rates them as a 8 out of 10 today.  Her symptoms are exacerbated by  abduction and forward flexion.  She states that she has no weakness, numbness, or tingling.  She has no neck pain or arm pain.  She feels that she has some stiffness in her arm.  On physical exam, she has pain with active abduction and forward flexion of the right arm.  She has a full range of motion.  She has no pain with passive range of motion.  She has a positive Hawkins test.  She has mild weakness with supraspinatus testing which seems to be limited by pain.  Her symptoms and physical exam are consistent with impingement syndrome of the right shoulder.  She was offered physical therapy for this but declined and requested a home exercise program.  She can also take over-the-counter ibuprofen  400 to 600 mg every 6 hr as needed for her pain.  I think that her symptoms should continue to improve  - Home exercise program -Ibuprofen  400 to 600 mg q6 prn Obesity (BMI 30-39.9) Patient has been tolerating Wegovy  0.25 mg.  She has no symptoms of nausea or vomiting.  She states that her pharmacy has only been sent prescriptions for 0.25 mg and has not been sent a prescription for 0.5 mg.  I think she would do well with increased dosage.  - Wegovy  0.5 mg weekly -Follow-up with Dr. Tobie on September 24 as previously scheduled.  May be candidate for further ramp-up of  Wegovy  at that time. Encounter for screening mammogram for malignant neoplasm of breast Patient requesting referral for mammogram at this time. - Referral for mammogram.  Orders Placed This Encounter  Procedures   MM 3D SCREENING MAMMOGRAM BILATERAL BREAST    Trilluim Pf;baseline No need- no issues- no implants- no reductions- no hx of br cancer- spoke with pt/ fj    Standing Status:   Future    Expiration Date:   06/07/2025    Reason for Exam (SYMPTOM  OR DIAGNOSIS REQUIRED):   Screening for Breast cancer    Is the patient pregnant?:   No    Preferred [facility]   Follow-up with Dr. Tobie on September 24 to  discuss obesity and hyperthyroidism.  Patient seen with Dr. Ronnald Karna Melvenia Napoleon, MD Internal [facility] Center Internal Medicine Resident PGY-1 Clinic Phone: [phone] Please contact the on call pager at [phone] for any urgent or emergent needs.

## 2024-06-07 NOTE — Assessment & Plan Note (Addendum)
 Patient has been tolerating Wegovy  0.25 mg.  She has no symptoms of nausea or vomiting.  She states that her pharmacy has only been sent prescriptions for 0.25 mg and has not been sent a prescription for 0.5 mg.  I think she would do well with increased dosage.  - Wegovy  0.5 mg weekly -Follow-up with Dr. Tobie on September 24 as previously scheduled.  May be candidate for further ramp-up of Wegovy  at that time.

## 2024-06-12 NOTE — Progress Notes (Signed)
 Internal Medicine Clinic Attending  I was physically present during the key portions of the resident provided service and participated in the medical decision making of patient's management care. I reviewed pertinent patient test results.  The assessment, diagnosis, and plan were formulated together and I agree with the documentation in the resident's note.  Dickie La, MD

## 2024-06-12 NOTE — Addendum Note (Signed)
 Addended by: KARNA FELLOWS on: 06/12/2024 09:11 AM   Modules accepted: Level of Service

## 2024-06-16 ENCOUNTER — Other Ambulatory Visit: Payer: Self-pay

## 2024-06-21 ENCOUNTER — Ambulatory Visit (AMBULATORY_SURGERY_CENTER): Payer: MEDICAID | Admitting: Gastroenterology

## 2024-06-21 ENCOUNTER — Encounter: Payer: Self-pay | Admitting: Gastroenterology

## 2024-06-21 VITALS — BP 123/84 | HR 65 | Temp 97.5°F | Resp 22 | Ht 65.0 in | Wt 183.0 lb

## 2024-06-21 DIAGNOSIS — K648 Other hemorrhoids: Secondary | ICD-10-CM

## 2024-06-21 DIAGNOSIS — K5730 Diverticulosis of large intestine without perforation or abscess without bleeding: Secondary | ICD-10-CM | POA: Diagnosis not present

## 2024-06-21 DIAGNOSIS — Z1211 Encounter for screening for malignant neoplasm of colon: Secondary | ICD-10-CM

## 2024-06-21 MED ORDER — SODIUM CHLORIDE 0.9 % IV SOLN: 500.0000 mL | Freq: Once | INTRAVENOUS | Status: DC

## 2024-06-21 NOTE — Patient Instructions (Signed)
  Educational handout provided to patient related to Hemorrhoids and Diverticulosis  Resume previous diet  Continue present medications  Repeat colonoscopy in 10 years for screening purposes.   YOU HAD AN ENDOSCOPIC PROCEDURE TODAY AT THE [facility]:   Refer to the procedure report that was given to you for any specific questions about what was found during the examination.  If the procedure report does not answer your questions, please call your gastroenterologist to clarify.  If you requested that your care partner not be given the details of your procedure findings, then the procedure report has been included in a sealed envelope for you to review at your convenience later.  YOU SHOULD EXPECT: Some feelings of bloating in the abdomen. Passage of more gas than usual.  Walking can help get rid of the air that was put into your GI tract during the procedure and reduce the bloating. If you had a lower endoscopy (such as a colonoscopy or flexible sigmoidoscopy) you may notice spotting of blood in your stool or on the toilet paper. If you underwent a bowel prep for your procedure, you may not have a normal bowel movement for a few days.  Please Note:  You might notice some irritation and congestion in your nose or some drainage.  This is from the oxygen used during your procedure.  There is no need for concern and it should clear up in a day or so.  SYMPTOMS TO REPORT IMMEDIATELY:  Following lower endoscopy (colonoscopy or flexible sigmoidoscopy):  Excessive amounts of blood in the stool  Significant tenderness or worsening of abdominal pains  Swelling of the abdomen that is new, acute  Fever of 100F or higher  For urgent or emergent issues, a gastroenterologist can be reached at any hour by calling [phone]. Do not use MyChart messaging for urgent concerns.    DIET:  We do recommend a small meal at first, but then you may proceed to your regular diet.  Drink plenty of  fluids but you should avoid alcoholic beverages for 24 hours.  ACTIVITY:  You should plan to take it easy for the rest of today and you should NOT DRIVE or use heavy machinery until tomorrow (because of the sedation medicines used during the test).    FOLLOW UP: Our staff will call the number listed on your records the next business day following your procedure.  We will call around 7:15- 8:00 am to check on you and address any questions or concerns that you may have regarding the information given to you following your procedure. If we do not reach you, we will leave a message.     If any biopsies were taken you will be contacted by phone or by letter within the next 1-3 weeks.  Please call us at [phone] if you have not heard about the biopsies in 3 weeks.    SIGNATURES/CONFIDENTIALITY: You and/or your care partner have signed paperwork which will be entered into your electronic medical record.  These signatures attest to the fact that that the information above on your After Visit Summary has been reviewed and is understood.  Full responsibility of the confidentiality of this discharge information lies with you and/or your care-partner.

## 2024-06-21 NOTE — Progress Notes (Signed)
 Sedate, gd SR, tolerated procedure well, VSS, report to RN

## 2024-06-21 NOTE — Progress Notes (Signed)
 Pt's states no medical or surgical changes since previsit or office visit.

## 2024-06-21 NOTE — Progress Notes (Signed)
 History and Physical:  This patient presents for endoscopic testing for: Encounter Diagnosis  Name Primary?   Special screening for malignant neoplasms, colon Yes    58 year old woman here today for for screening colonoscopy Patient denies chronic abdominal pain, rectal bleeding, constipation or diarrhea.   Patient is otherwise without complaints or active issues today.   Past Medical History: Past Medical History:  Diagnosis Date   GAD (generalized anxiety disorder) 05/04/2023   H/O vitamin D  deficiency 03/23/2024   Hyperthyroidism 01/21/2024   Hypothyroidism 01/21/2024     Past Surgical History: Past Surgical History:  Procedure Laterality Date   TUBAL LIGATION      Allergies: Allergies  Allergen Reactions   Lamictal [Lamotrigine] Swelling and Rash    Outpatient Meds: Current Outpatient Medications  Medication Sig Dispense Refill   Insulin  Pen Needle (PEN NEEDLES) 32G X 4 MM MISC Use as directed 100 each 1   Lurasidone  HCl (LATUDA ) 60 MG TABS Take 1 tablet (60 mg total) by mouth at bedtime. 90 tablet 0   methimazole  (TAPAZOLE ) 5 MG tablet Take 1 tablet (5 mg total) by mouth daily. 30 tablet 3   semaglutide -weight management (WEGOVY ) 0.5 MG/0.5ML SOAJ SQ injection Inject 0.5 mg into the skin once a week. 2 mL 1   sennosides-docusate sodium  (SENOKOT-S) 8.6-50 MG tablet Take 1 tablet by mouth daily.     Current Facility-Administered Medications  Medication Dose Route Frequency Provider Last Rate Last Admin   0.9 %  sodium chloride  infusion  500 mL Intravenous Once Danis, Victory CROME III, MD          ___________________________________________________________________ Objective   Exam:  BP 110/80   Pulse 66   Temp (!) 97.5 F (36.4 C) (Skin)   Ht 5' 5 (1.651 m)   Wt 183 lb (83 kg)   SpO2 98%   BMI 30.45 kg/m   CV: regular , S1/S2 Resp: clear to auscultation bilaterally, normal RR and effort noted GI: soft, no tenderness, with active bowel  sounds.   Assessment: Encounter Diagnosis  Name Primary?   Special screening for malignant neoplasms, colon Yes     Plan: Colonoscopy   The benefits and risks of the planned procedure(s) were described in detail with the patient or (when appropriate) their health care proxy.  Risks were outlined as including, but not limited to, bleeding, infection, perforation, adverse medication reaction leading to cardiac or pulmonary decompensation, pancreatitis (if ERCP).  The limitation of incomplete mucosal visualization was also discussed.  No guarantees or warranties were given.  The patient is appropriate for an endoscopic procedure in the ambulatory setting.   - Victory Brand, MD

## 2024-06-21 NOTE — Op Note (Signed)
 [facility] Patient Name: Tracy Keith Procedure Date: 06/21/2024 8:04 AM MRN: [id number] Endoscopist: Victory L. Legrand , MD, [id number] Age: 58 Referring MD:  Date of Birth: December 21, 1965 Gender: Female Account #: [id number] Procedure:                Colonoscopy Indications:              Screening for colorectal malignant neoplasm, This                            is the patient's first colonoscopy Medicines:                Monitored Anesthesia Care Procedure:                Pre-Anesthesia Assessment:                           - Prior to the procedure, a History and Physical                            was performed, and patient medications and                            allergies were reviewed. The patient's tolerance of                            previous anesthesia was also reviewed. The risks                            and benefits of the procedure and the sedation                            options and risks were discussed with the patient.                            All questions were answered, and informed consent                            was obtained. Prior Anticoagulants: The patient has                            taken no anticoagulant or antiplatelet agents. ASA                            Grade Assessment: II - A patient with mild systemic                            disease. After reviewing the risks and benefits,                            the patient was deemed in satisfactory condition to                            undergo the procedure.  After obtaining informed consent, the colonoscope                            was passed under direct vision. Throughout the                            procedure, the patient's blood pressure, pulse, and                            oxygen saturations were monitored continuously. The                            CF [id number] #[id number] was introduced through the anus                            and advanced to the  the cecum, identified by                            appendiceal orifice and ileocecal valve. The                            colonoscopy was somewhat difficult due to a                            redundant colon. Successful completion of the                            procedure was aided by using manual pressure and                            straightening and shortening the scope to obtain                            bowel loop reduction. The patient tolerated the                            procedure well. The quality of the bowel                            preparation was excellent. The ileocecal valve,                            appendiceal orifice, and rectum were photographed. Scope In: 8:17:17 AM Scope Out: 8:35:26 AM Scope Withdrawal Time: 0 hours 12 minutes 0 seconds  Total Procedure Duration: 0 hours 18 minutes 9 seconds  Findings:                 The perianal and digital rectal examinations were                            normal.                           Repeat examination of right colon under NBI  performed.                           A few diverticula were found in the left colon.                           Internal hemorrhoids were found.                           The exam was otherwise without abnormality on                            direct and retroflexion views. Complications:            No immediate complications. Estimated Blood Loss:     Estimated blood loss: none. Impression:               - Diverticulosis in the left colon.                           - Internal hemorrhoids.                           - The examination was otherwise normal on direct                            and retroflexion views.                           - No specimens collected. Recommendation:           - Patient has a contact number available for                            emergencies. The signs and symptoms of potential                            delayed complications  were discussed with the                            patient. Return to normal activities tomorrow.                            Written discharge instructions were provided to the                            patient.                           - Resume previous diet.                           - Continue present medications.                           - Repeat colonoscopy in 10 years for screening  purposes. Lititia Sen L. Legrand, MD 06/21/2024 8:38:29 AM This report has been signed electronically.

## 2024-06-22 ENCOUNTER — Telehealth: Payer: Self-pay | Admitting: *Deleted

## 2024-06-22 NOTE — Telephone Encounter (Signed)
  Follow up Call-     06/21/2024    7:13 AM  Call back number  Post procedure Call Back phone  # [phone]  Permission to leave phone message Yes     Patient questions:  Do you have a fever, pain , or abdominal swelling? No. Pain Score  0 *  Have you tolerated food without any problems? Yes.    Have you been able to return to your normal activities? Yes.    Do you have any questions about your discharge instructions: Diet   No. Medications  No. Follow up visit  No.  Do you have questions or concerns about your Care? No.  Actions: * If pain score is 4 or above: No action needed, pain <4.

## 2024-06-30 ENCOUNTER — Ambulatory Visit
Admission: RE | Admit: 2024-06-30 | Discharge: 2024-06-30 | Disposition: A | Discharge status: Home / Self Care | Payer: MEDICAID | Source: Ambulatory Visit | Attending: Internal Medicine | Admitting: Internal Medicine

## 2024-06-30 DIAGNOSIS — Z1231 Encounter for screening mammogram for malignant neoplasm of breast: Secondary | ICD-10-CM

## 2024-07-04 ENCOUNTER — Telehealth: Payer: Self-pay | Admitting: Student

## 2024-07-04 ENCOUNTER — Other Ambulatory Visit: Payer: Self-pay

## 2024-07-04 DIAGNOSIS — E669 Obesity, unspecified: Secondary | ICD-10-CM

## 2024-07-04 MED ORDER — WEGOVY 1 MG/0.5ML ~~LOC~~ SOAJ
1.0000 mg | SUBCUTANEOUS | 3 refills | Status: AC
  Filled 2024-07-04: qty 2, 28d supply, fill #0

## 2024-07-04 NOTE — Telephone Encounter (Signed)
 Received message from patient as it has been a month on the 0.5 mg dose of Wegovy . She is tolerating well and has had no issues with the medication. She reports having some weight loss. She wanted to know if she could increase the dose. As it has been one month since the last increase, I think it will be okay for her to increase to 1 mg dose weekly. Talked to the patient on the phone regarding side effects to look out for and patient instructed to call the clinic if she does develop any side effects.

## 2024-07-07 ENCOUNTER — Other Ambulatory Visit: Payer: Self-pay

## 2024-07-17 ENCOUNTER — Other Ambulatory Visit: Payer: Self-pay

## 2024-07-18 ENCOUNTER — Other Ambulatory Visit (HOSPITAL_COMMUNITY): Payer: Self-pay

## 2024-07-18 ENCOUNTER — Other Ambulatory Visit (HOSPITAL_BASED_OUTPATIENT_CLINIC_OR_DEPARTMENT_OTHER): Payer: Self-pay

## 2024-07-18 ENCOUNTER — Other Ambulatory Visit: Payer: Self-pay

## 2024-07-19 ENCOUNTER — Encounter: Payer: Self-pay | Admitting: Student

## 2024-07-19 ENCOUNTER — Other Ambulatory Visit: Payer: Self-pay

## 2024-07-19 ENCOUNTER — Ambulatory Visit: Payer: MEDICAID | Admitting: Student

## 2024-07-19 VITALS — BP 113/76 | HR 71 | Temp 98.0°F | Ht 65.0 in | Wt 179.8 lb

## 2024-07-19 DIAGNOSIS — Z6829 Body mass index (BMI) 29.0-29.9, adult: Secondary | ICD-10-CM

## 2024-07-19 DIAGNOSIS — E0590 Thyrotoxicosis, unspecified without thyrotoxic crisis or storm: Secondary | ICD-10-CM | POA: Diagnosis not present

## 2024-07-19 DIAGNOSIS — E669 Obesity, unspecified: Secondary | ICD-10-CM

## 2024-07-19 DIAGNOSIS — F411 Generalized anxiety disorder: Secondary | ICD-10-CM

## 2024-07-19 DIAGNOSIS — F319 Bipolar disorder, unspecified: Secondary | ICD-10-CM

## 2024-07-19 DIAGNOSIS — F3178 Bipolar disorder, in full remission, most recent episode mixed: Secondary | ICD-10-CM

## 2024-07-19 DIAGNOSIS — E041 Nontoxic single thyroid nodule: Secondary | ICD-10-CM

## 2024-07-19 DIAGNOSIS — Z23 Encounter for immunization: Secondary | ICD-10-CM

## 2024-07-19 DIAGNOSIS — Z758 Other problems related to medical facilities and other health care: Secondary | ICD-10-CM

## 2024-07-19 DIAGNOSIS — R7989 Other specified abnormal findings of blood chemistry: Secondary | ICD-10-CM

## 2024-07-19 MED ORDER — METHIMAZOLE 5 MG PO TABS
5.0000 mg | ORAL_TABLET | Freq: Every day | ORAL | 11 refills | Status: AC
  Filled 2024-08-13: qty 30, 30d supply, fill #0
  Filled 2024-09-17: qty 30, 30d supply, fill #1
  Filled 2024-10-20: qty 30, 30d supply, fill #2
  Filled 2024-11-25: qty 30, 30d supply, fill #3

## 2024-07-19 MED ORDER — WEGOVY 1.7 MG/0.75ML ~~LOC~~ SOAJ
1.7000 mg | SUBCUTANEOUS | 1 refills | Status: AC
  Filled 2024-07-19 – 2024-07-24 (×5): qty 3, 28d supply, fill #0

## 2024-07-19 NOTE — Assessment & Plan Note (Addendum)
 Patient has lost 4 pounds since her initiation of Wegovy .  She states she is doing well.  She denies any nausea or vomiting.  Did inform the patient that Medicaid will stop covering Wegovy .  Patient understood this.  She was slightly displeased about that, but understood.  Will see if we can get her 1 more refill before the coverage expires.  Plan: - Lifestyle modification - Continue Wegovy  until coverage expires - Counseled patient on lifestyle modifications for weight loss

## 2024-07-19 NOTE — Patient Instructions (Signed)
 Fontaine A Rowzee,Thank you for allowing me to take part in your care today.  Here are your instructions.  1.  I am going to check your labs today.  I will call you with the results.  2.  Unfortunately Medicaid is going to stop covering Wegovy .  Continue making lifestyle changes.  I hope is that the medicine will become cheaper and you can get back on it.   3.  Please return in 3 months.  4.  He received your flu vaccine today, may develop some fevers or chills, please take Tylenol  this is the case.  PLEASE BRING YOUR MEDICATIONS TO EVERY APPOINTMENT  Thank you, Dr. Tobie  If you have any other questions please contact the internal medicine clinic at [phone] If it is after hours, please call the [facility] at [phone] and then ask the person who picks up for the resident on call.

## 2024-07-19 NOTE — Assessment & Plan Note (Signed)
 Patient presents for hyperthyroidism follow-up.  Patient is currently on methimazole  5 mg daily.  No acute concerns.  She denies any side effects.  She denies any symptoms of hyperthyroidism.  Will check TSH, free T4, total T3 today.  Will also check thyroid  hormone receptor antibodies to evaluate for Graves' disease.  Plan: - Check TSH - Check free T4, total T3 - Check thyroid  hormone receptor antibodies

## 2024-07-19 NOTE — Progress Notes (Signed)
   CC: Follow-up on hyperthyroidism  HPI:  [name] is a 58 y.o.  female with past medical history of hyperthyroidism, history of a thyroid  nodule, bipolar disorder who presents for appointment discussing thyroid  disease.  Please see assessment and plan for full HPI.   Medications: Bipolar disorder: Latuda  60 mg nightly Hyperthyroidism methimazole  5 mg daily Obesity: Wegovy  1 mg weekly  Past Medical History:  Diagnosis Date   GAD (generalized anxiety disorder) 05/04/2023   H/O vitamin D  deficiency 03/23/2024   Hyperthyroidism 01/21/2024   Hypothyroidism 01/21/2024     Current Outpatient Medications:    Insulin  Pen Needle (PEN NEEDLES) 32G X 4 MM MISC, Use as directed, Disp: 100 each, Rfl: 1   Lurasidone  HCl (LATUDA ) 60 MG TABS, Take 1 tablet (60 mg total) by mouth at bedtime., Disp: 90 tablet, Rfl: 0   methimazole  (TAPAZOLE ) 5 MG tablet, Take 1 tablet (5 mg total) by mouth daily., Disp: 30 tablet, Rfl: 3   semaglutide -weight management (WEGOVY ) 1 MG/0.5ML SOAJ SQ injection, Inject 1 mg into the skin once a week., Disp: 2 mL, Rfl: 3   sennosides-docusate sodium  (SENOKOT-S) 8.6-50 MG tablet, Take 1 tablet by mouth daily., Disp: , Rfl:   Review of Systems:    Negative except for what is stated in HPI  Physical Exam:  Vitals:   07/19/24 0931  BP: 113/76  Pulse: 71  Temp: 98 F (36.7 C)  TempSrc: Oral  SpO2: 98%  Weight: 179 lb 12.8 oz (81.6 kg)  Height: 5' 5 (1.651 m)   General: Patient is sitting comfortably in the room  Head: Normocephalic, atraumatic  Cardio: Regular rate and rhythm, no murmurs, rubs or gallops Pulmonary: Clear to ausculation bilaterally with no rales, rhonchi, and crackles    Assessment & Plan:   Assessment & Plan Hyperthyroidism Patient presents for hyperthyroidism follow-up.  Patient is currently on methimazole  5 mg daily.  No acute concerns.  She denies any side effects.  She denies any symptoms of hyperthyroidism.  Will check TSH,  free T4, total T3 today.  Will also check thyroid  hormone receptor antibodies to evaluate for Graves' disease.  Plan: - Check TSH - Check free T4, total T3 - Check thyroid  hormone receptor antibodies Flu vaccine need Administer flu vaccine today Obesity (BMI 30-39.9) Patient has lost 4 pounds since her initiation of Wegovy .  She states she is doing well.  She denies any nausea or vomiting.  Did inform the patient that Medicaid will stop covering Wegovy .  Patient understood this.  She was slightly displeased about that, but understood.  Will see if we can get her 1 more refill before the coverage expires.  Plan: - Lifestyle modification - Continue Wegovy  until coverage expires - Counseled patient on lifestyle modifications for weight loss   Patient discussed with Dr. Rosan Libby Blanch, DO Internal Medicine Resident PGY-3

## 2024-07-20 ENCOUNTER — Ambulatory Visit: Payer: Self-pay | Admitting: Student

## 2024-07-22 LAB — T4, FREE: Free T4: 0.65 ng/dL — ABNORMAL LOW (ref 0.82–1.77)

## 2024-07-22 LAB — TSH: TSH: 2.7 u[IU]/mL (ref 0.450–4.500)

## 2024-07-22 LAB — THYROTROPIN RECEPTOR AUTOABS: Thyrotropin Receptor Ab: <1.1 IU/L (ref 0.00–1.75)

## 2024-07-22 LAB — T3: T3, Total: 86 ng/dL (ref 71–180)

## 2024-07-24 ENCOUNTER — Other Ambulatory Visit (HOSPITAL_COMMUNITY): Payer: Self-pay

## 2024-07-24 ENCOUNTER — Other Ambulatory Visit: Payer: Self-pay

## 2024-07-24 NOTE — Progress Notes (Signed)
 Internal Medicine Clinic Attending  Case discussed with the resident at the time of the visit.  We reviewed the resident's history and exam and pertinent patient test results.  I agree with the assessment, diagnosis, and plan of care documented in the resident's note.

## 2024-08-01 ENCOUNTER — Ambulatory Visit: Payer: Self-pay

## 2024-08-14 ENCOUNTER — Other Ambulatory Visit: Payer: Self-pay

## 2024-08-21 ENCOUNTER — Other Ambulatory Visit: Payer: Self-pay

## 2024-09-02 ENCOUNTER — Other Ambulatory Visit: Payer: Self-pay

## 2024-10-23 ENCOUNTER — Other Ambulatory Visit: Payer: Self-pay

## 2024-10-23 ENCOUNTER — Ambulatory Visit: Payer: MEDICAID | Admitting: Student

## 2024-10-23 ENCOUNTER — Other Ambulatory Visit (HOSPITAL_COMMUNITY): Payer: Self-pay

## 2024-10-23 VITALS — BP 122/90 | HR 78 | Temp 98.2°F | Ht 65.0 in | Wt 181.6 lb

## 2024-10-23 DIAGNOSIS — G2401 Drug induced subacute dyskinesia: Secondary | ICD-10-CM | POA: Insufficient documentation

## 2024-10-23 DIAGNOSIS — Z124 Encounter for screening for malignant neoplasm of cervix: Secondary | ICD-10-CM | POA: Diagnosis not present

## 2024-10-23 DIAGNOSIS — Z5320 Procedure and treatment not carried out because of patient's decision for unspecified reasons: Secondary | ICD-10-CM | POA: Diagnosis not present

## 2024-10-23 DIAGNOSIS — N3000 Acute cystitis without hematuria: Secondary | ICD-10-CM | POA: Insufficient documentation

## 2024-10-23 DIAGNOSIS — F3178 Bipolar disorder, in full remission, most recent episode mixed: Secondary | ICD-10-CM | POA: Diagnosis not present

## 2024-10-23 DIAGNOSIS — E0590 Thyrotoxicosis, unspecified without thyrotoxic crisis or storm: Secondary | ICD-10-CM

## 2024-10-23 LAB — POCT URINALYSIS DIPSTICK
Bilirubin, UA: NEGATIVE
Blood, UA: POSITIVE
Glucose, UA: NEGATIVE
Ketones, UA: NEGATIVE
Nitrite, UA: POSITIVE
Protein, UA: NEGATIVE
Spec Grav, UA: 1.01
Urobilinogen, UA: 0.2 U/dL
pH, UA: 6

## 2024-10-23 MED ORDER — LURASIDONE HCL 40 MG PO TABS
40.0000 mg | ORAL_TABLET | Freq: Every day | ORAL | 2 refills | Status: AC
  Filled 2024-10-23: qty 30, 30d supply, fill #0
  Filled 2024-11-25: qty 30, 30d supply, fill #1

## 2024-10-23 MED ORDER — NITROFURANTOIN MONOHYD MACRO 100 MG PO CAPS
100.0000 mg | ORAL_CAPSULE | Freq: Two times a day (BID) | ORAL | 0 refills | Status: AC
  Filled 2024-10-23: qty 10, 5d supply, fill #0

## 2024-10-23 NOTE — Assessment & Plan Note (Addendum)
 04/19/2024: Ultrasound seen on biopsy thyroid  showed benign follicular nodule 07/19/2024: TSH 2.700, free T4 0.6 (low), T3 total 86 (normal), thyrotropin receptor antibody <1.10 (normal)  Patient denies any symptoms/side effects. Prior labs stable.  - Repeat labs today to assess for stability > then repeat in 6 months  - Patient is advised to continue taking methimazole  5 mg daily

## 2024-10-23 NOTE — Progress Notes (Signed)
 "  Established Patient Office Visit  Subjective   Patient ID: Tracy Keith, female    DOB: Sep 29, 1966  Age: 58 y.o. MRN: [id number]  Chief Complaint  Patient presents with   Medication Dose Change    Patient wants to decrease    Follow-up   Strong Urine    String urine smell, patient thinks she has a yeast infection    [name] is a 58 y.o. female with past medical history of hypothyroidism, history of thyroid  nodule, bipolar disorder who presents to the office for 64-month follow-up from last office visit on 07/15/2024.  Medications: Bipolar disorder: Latuda  40 mg nightly Hyperthyroidism methimazole  5 mg daily  Review of Systems:  As per assessment and Plan   Objective:     Vitals:   10/23/24 0933  BP: (!) 122/90  Pulse: 78  Temp: 98.2 F (36.8 C)  TempSrc: Oral  SpO2: 100%  Weight: 181 lb 9.6 oz (82.4 kg)  Height: 5' 5 (1.651 m)    Physical Exam General: Sitting in chair, no acute distress Neck: No enlargement noted, no lymph adenopathy  Cardiovascular: Regular rate Pulmonary: Breathing comfortably Abdomen: Soft, nontender, nondistended, no suprapubic tenderness MSK: No lower extremity edema bilaterally  Last thyroid  functions Lab Results  Component Value Date   TSH 2.700 07/19/2024   T3TOTAL 86 07/19/2024   T4TOTAL 5.4 03/06/2024   FREET4 0.65 (L) 07/19/2024      The 10-year ASCVD risk score (Arnett DK, et al., 2019) is: 4.1%    Assessment & Plan:   Patient discussed with Dr. Shawn  Problem List Items Addressed This Visit       Endocrine   Hyperthyroidism - Primary   04/19/2024: Ultrasound seen on biopsy thyroid  showed benign follicular nodule 07/19/2024: TSH 2.700, free T4 0.6 (low), T3 total 86 (normal), thyrotropin receptor antibody <1.10 (normal)  Patient denies any symptoms/side effects. Prior labs stable.  - Repeat labs today to assess for stability > then repeat in 6 months  - Patient is advised to continue taking methimazole  5  mg daily      Relevant Orders   TSH   T4, Free   T3     Nervous and Auditory   Tardive dyskinesia   Patient reports abnormal jaw movement noticed by family for the past 3 months that has been worsening. Denies any involuntary movements of extremity or tongue.  Patient is currently taking Latuda  60 mg daily, patient is advised to decrease Latuda  to 40 mg daily.  - Decrease Latuda  to 40 mg daily - Refer to psychiatry      Relevant Orders   Ambulatory referral to Psychiatry     Genitourinary   Acute cystitis without hematuria   Presented with symptoms of urine odor that started on Saturday, denies any dysuria or urine frequency.  No vaginal discharge.  Point-of-care UA noted for leukocytes and nitrites. -Start Macrobid  100 mg twice daily for 5 days      Relevant Orders   POCT Urinalysis Dipstick (81002) (Completed)     Other   Unspecifed bipolar disorder, in full remission, most recent episode mixed (HCC)   Please see tardive dyskinesia, advised to decrease Latuda  to 20 mg daily. -Referral to psychiatrist placed      Relevant Orders   Ambulatory referral to Psychiatry   Screening for hepatitis C declined   Patient declined hepatitis C screening      Pap smear for cervical cancer screening   Patient reports that she would  get a Pap smear at the next office visit. -Pap smear at the next OV       Return in about 1 month (around 11/23/2024), or pap smear and Bipolar.    Toma Edwards, DO "

## 2024-10-23 NOTE — Assessment & Plan Note (Signed)
 Patient reports that she would get a Pap smear at the next office visit. -Pap smear at the next OV

## 2024-10-23 NOTE — Assessment & Plan Note (Signed)
 Please see tardive dyskinesia, advised to decrease Latuda  to 20 mg daily. -Referral to psychiatrist placed

## 2024-10-23 NOTE — Assessment & Plan Note (Signed)
 Presented with symptoms of urine odor that started on Saturday, denies any dysuria or urine frequency.  No vaginal discharge.  Point-of-care UA noted for leukocytes and nitrites. -Start Macrobid  100 mg twice daily for 5 days

## 2024-10-23 NOTE — Assessment & Plan Note (Signed)
 Patient declined hepatitis C screening

## 2024-10-23 NOTE — Progress Notes (Signed)
 Internal Medicine Clinic Attending  Case discussed with the resident at the time of the visit.  We reviewed the resident's history and exam and pertinent patient test results.  I agree with the assessment, diagnosis, and plan of care documented in the resident's note.

## 2024-10-23 NOTE — Patient Instructions (Addendum)
 Thank you, Ms.Jemeka A Tomczak for allowing us  to provide your care today. Today we discussed:  Thyroid : - I have repeated your labs, I will call you and update you  For your UTI - Start Macrobid , take 1 tablet by mouth twice a day for 5 days  - Stay hydrated   For your Bipolar - Lets decrease Latuda  to 40 mg daily   I have ordered the following labs for you:  Lab Orders  No laboratory test(s) ordered today     Tests ordered today:  POCT UA   Referrals ordered today:   Referral Orders  No referral(s) requested today     I have ordered the following medication/changed the following medications:   Stop the following medications: Medications Discontinued During This Encounter  Medication Reason   Lurasidone  HCl (LATUDA ) 60 MG TABS      Start the following medications: No orders of the defined types were placed in this encounter.    Follow up: 1 month for Hyperthyroid, pap smear and bipolar    Remember:   Should you have any questions or concerns please call the internal medicine clinic at [phone].     Rayann Atway, D.O. [facility]

## 2024-10-23 NOTE — Assessment & Plan Note (Signed)
 Patient reports abnormal jaw movement noticed by family for the past 3 months that has been worsening. Denies any involuntary movements of extremity or tongue.  Patient is currently taking Latuda  60 mg daily, patient is advised to decrease Latuda  to 40 mg daily.  - Decrease Latuda  to 40 mg daily - Refer to psychiatry

## 2024-10-24 LAB — T4, FREE: Free T4: 0.74 ng/dL — ABNORMAL LOW (ref 0.82–1.77)

## 2024-10-24 LAB — TSH: TSH: 2.97 u[IU]/mL (ref 0.450–4.500)

## 2024-10-24 LAB — T3: T3, Total: 108 ng/dL (ref 71–180)
